# Patient Record
Sex: Male | Born: 1965 | Race: Black or African American | Hispanic: No | Smoking: Never smoker
Health system: Southern US, Community
[De-identification: ages and names within clinical notes are randomized; demographics above are authoritative.]

## PROBLEM LIST (undated history)

## (undated) DIAGNOSIS — E079 Disorder of thyroid, unspecified: Secondary | ICD-10-CM

## (undated) DIAGNOSIS — N529 Male erectile dysfunction, unspecified: Secondary | ICD-10-CM

## (undated) HISTORY — PX: WISDOM TOOTH EXTRACTION: SHX21

## (undated) HISTORY — DX: Male erectile dysfunction, unspecified: N52.9

---

## 2013-01-12 ENCOUNTER — Ambulatory Visit (INDEPENDENT_AMBULATORY_CARE_PROVIDER_SITE_OTHER): Payer: BC Managed Care – PPO | Admitting: Physician Assistant

## 2013-01-12 ENCOUNTER — Encounter: Payer: Self-pay | Admitting: Physician Assistant

## 2013-01-12 VITALS — BP 130/82 | HR 78 | Temp 98.2°F | Resp 14 | Ht 66.0 in | Wt 155.0 lb

## 2013-01-12 DIAGNOSIS — N529 Male erectile dysfunction, unspecified: Secondary | ICD-10-CM

## 2013-01-12 MED ORDER — SILDENAFIL CITRATE 100 MG PO TABS: 100.0000 mg | ORAL_TABLET | Freq: Every day | ORAL | Status: DC | PRN

## 2013-01-12 NOTE — Progress Notes (Signed)
   Patient ID: Aaron Mcgee MRN: [id number], DOB: 1966-05-12, 47 y.o. Date of Encounter: 01/12/2013, 7:34 PM    Chief Complaint:  Chief Complaint  Patient presents with  . Medication Refill    Has tried Viagra in past but would like to try Cialis     HPI: 47 y.o. year old AA male here for f/u of Erectile Dysfunction. His LOV here was 03/2010. Says he has been having physicals through his work. Says he went through a divorce so there was a period of time that he was not sexually active. However, he now is sexually active again and needs refill on Viagra. Says it worked well in past. Says he got the 100mg  but was able to cut them in half.  Has no other complaints.  Has no other medical history. Says his physicals have checked out to be fine.    Home Meds: See attached medication section for any medications that were entered at today's visit. The computer does not put those onto this list.The following list is a list of meds entered prior to today's visit.   No current outpatient prescriptions on file prior to visit.   No current facility-administered medications on file prior to visit.    Allergies: No Known Allergies    Review of Systems: See HPI for pertinent ROS. All other ROS negative.    Physical Exam: Blood pressure 130/82, pulse 78, temperature 98.2 F (36.8 C), temperature source Oral, resp. rate 14, height 5\' 6"  (1.676 m), weight 155 lb (70.308 kg)., Body mass index is 25.03 kg/(m^2). General: WNWD AAM. Appears in no acute distress. Lungs: Clear bilaterally to auscultation without wheezes, rales, or rhonchi. Breathing is unlabored. Heart: Regular rhythm. No murmurs, rubs, or gallops. Msk:  Strength and tone normal for age. Extremities/Skin: Warm and dry.  No edema. Neuro: Alert and oriented X 3. Moves all extremities spontaneously. Gait is normal. CNII-XII grossly in tact. Psych:  Responds to questions appropriately with a normal affect.     ASSESSMENT AND PLAN:   47 y.o. year old male with  1. Erectile dysfunction - sildenafil (VIAGRA) 100 MG tablet; Take 1 tablet (100 mg total) by mouth daily as needed for erectile dysfunction.  Dispense: 10 tablet; Refill: [address] [place], [place], [facility] 01/12/2013 7:34 PM

## 2013-07-12 ENCOUNTER — Telehealth: Payer: Self-pay | Admitting: Physician Assistant

## 2013-07-12 DIAGNOSIS — N529 Male erectile dysfunction, unspecified: Secondary | ICD-10-CM

## 2013-07-12 MED ORDER — SILDENAFIL CITRATE 100 MG PO TABS: 100.0000 mg | ORAL_TABLET | Freq: Every day | ORAL | Status: DC | PRN

## 2013-07-12 NOTE — Telephone Encounter (Signed)
RX sent to pharmacy  

## 2013-07-12 NOTE — Telephone Encounter (Signed)
Pharmacy in [place] generic of vigra, pt is trying to get the generic form and the pharmacy is needing a prescription sent over  Pharmacy is [facility] [phone] phone number) Call back number is [phone] (call pt once this taken care of)

## 2014-12-07 ENCOUNTER — Other Ambulatory Visit: Payer: Self-pay | Admitting: Family Medicine

## 2014-12-07 ENCOUNTER — Telehealth: Payer: Self-pay | Admitting: Physician Assistant

## 2014-12-07 DIAGNOSIS — Z0000 Encounter for general adult medical examination without abnormal findings: Secondary | ICD-10-CM

## 2014-12-07 NOTE — Telephone Encounter (Signed)
Pt has not been seen since 2014.  Has lab appt for tomorrow.  Needs to be seen by provider.  Left him a message.  We can not refill anything until he sees provider.  Has lab appt tomorro but needs provider appt.

## 2014-12-07 NOTE — Telephone Encounter (Signed)
Patient calling for refill of viagra [phone]

## 2014-12-08 ENCOUNTER — Encounter: Payer: Self-pay | Admitting: Family Medicine

## 2014-12-08 ENCOUNTER — Ambulatory Visit (INDEPENDENT_AMBULATORY_CARE_PROVIDER_SITE_OTHER): Payer: BLUE CROSS/BLUE SHIELD | Admitting: Family Medicine

## 2014-12-08 ENCOUNTER — Other Ambulatory Visit: Payer: BLUE CROSS/BLUE SHIELD

## 2014-12-08 VITALS — BP 110/60 | HR 80 | Temp 98.0°F | Resp 14 | Wt 169.0 lb

## 2014-12-08 DIAGNOSIS — Z8042 Family history of malignant neoplasm of prostate: Secondary | ICD-10-CM

## 2014-12-08 DIAGNOSIS — Z125 Encounter for screening for malignant neoplasm of prostate: Secondary | ICD-10-CM

## 2014-12-08 DIAGNOSIS — N521 Erectile dysfunction due to diseases classified elsewhere: Secondary | ICD-10-CM | POA: Diagnosis not present

## 2014-12-08 DIAGNOSIS — Z0000 Encounter for general adult medical examination without abnormal findings: Secondary | ICD-10-CM

## 2014-12-08 LAB — CBC WITH DIFFERENTIAL/PLATELET
Basophils Absolute: 0 10*3/uL (ref 0.0–0.1)
Basophils Relative: 0 % (ref 0–1)
Eosinophils Absolute: 0 10*3/uL (ref 0.0–0.7)
Eosinophils Relative: 1 % (ref 0–5)
HCT: 43.9 % (ref 39.0–52.0)
Hemoglobin: 14 g/dL (ref 13.0–17.0)
Lymphocytes Relative: 36 % (ref 12–46)
Lymphs Abs: 1.6 10*3/uL (ref 0.7–4.0)
MCH: 27.7 pg (ref 26.0–34.0)
MCHC: 31.9 g/dL (ref 30.0–36.0)
MCV: 86.9 fL (ref 78.0–100.0)
MPV: 9.4 fL (ref 8.6–12.4)
Monocytes Absolute: 0.4 10*3/uL (ref 0.1–1.0)
Monocytes Relative: 8 % (ref 3–12)
Neutro Abs: 2.5 10*3/uL (ref 1.7–7.7)
Neutrophils Relative %: 55 % (ref 43–77)
Platelets: 276 10*3/uL (ref 150–400)
RBC: 5.05 MIL/uL (ref 4.22–5.81)
RDW: 14.2 % (ref 11.5–15.5)
WBC: 4.5 10*3/uL (ref 4.0–10.5)

## 2014-12-08 LAB — COMPLETE METABOLIC PANEL WITH GFR
ALT: 32 U/L (ref 0–53)
AST: 18 U/L (ref 0–37)
Albumin: 3.9 g/dL (ref 3.5–5.2)
Alkaline Phosphatase: 63 U/L (ref 39–117)
BUN: 11 mg/dL (ref 6–23)
CO2: 27 mEq/L (ref 19–32)
Calcium: 9.3 mg/dL (ref 8.4–10.5)
Chloride: 104 mEq/L (ref 96–112)
Creat: 0.92 mg/dL (ref 0.50–1.35)
GFR, Est African American: >89 mL/min
GFR, Est Non African American: >89 mL/min
Glucose, Bld: 98 mg/dL (ref 70–99)
Potassium: 4.6 mEq/L (ref 3.5–5.3)
Sodium: 137 mEq/L (ref 135–145)
Total Bilirubin: 0.7 mg/dL (ref 0.2–1.2)
Total Protein: 7.1 g/dL (ref 6.0–8.3)

## 2014-12-08 LAB — LIPID PANEL
Cholesterol: 170 mg/dL (ref 0–200)
HDL: 92 mg/dL (ref >=40)
LDL Cholesterol: 69 mg/dL (ref 0–99)
Total CHOL/HDL Ratio: 1.8 Ratio
Triglycerides: 46 mg/dL (ref <150)
VLDL: 9 mg/dL (ref 0–40)

## 2014-12-08 MED ORDER — SILDENAFIL CITRATE 20 MG PO TABS: ORAL_TABLET | ORAL | Status: DC

## 2014-12-08 NOTE — Progress Notes (Signed)
   Subjective:    Patient ID: Aaron Mcgee, male    DOB: 1965-12-24, 49 y.o.   MRN: [id number]  HPI Patient has a history of erectile dysfunction. He is currently taking sildenafil, 20 mg tablets, 3 tablets by mouth daily when necessary erectile dysfunction. He is requesting a refill on his medication. He never uses nitroglycerin. He denies any chest pain or unstable angina. He denies any vision changes or hearing changes on the medication. He denies any side effects on the medication. He does have a very strong family history of prostate cancer. He is due for rectal exam as well as a PSA. Otherwise he is completely asymptomatic. No past medical history on file. No past surgical history on file. Current Outpatient Prescriptions on File Prior to Visit  Medication Sig Dispense Refill  . sildenafil (VIAGRA) 100 MG tablet Take 1 tablet (100 mg total) by mouth daily as needed for erectile dysfunction. 10 tablet 11   No current facility-administered medications on file prior to visit.   No Known Allergies History   Social History  . Marital Status: Married    Spouse Name: N/A  . Number of Children: N/A  . Years of Education: N/A   Occupational History  . Not on file.   Social History Main Topics  . Smoking status: Never Smoker   . Smokeless tobacco: Not on file  . Alcohol Use: No  . Drug Use: No  . Sexual Activity: Not on file   Other Topics Concern  . Not on file   Social History Narrative      Review of Systems  All other systems reviewed and are negative.      Objective:   Physical Exam  Constitutional: He appears well-developed and well-nourished.  Neck: Neck supple. No JVD present. No thyromegaly present.  Cardiovascular: Normal rate, regular rhythm, normal heart sounds and intact distal pulses.   No murmur heard. Pulmonary/Chest: Effort normal and breath sounds normal. No respiratory distress. He has no wheezes. He has no rales.  Abdominal: Soft. Bowel sounds  are normal. He exhibits no distension. There is no tenderness. There is no rebound and no guarding.  Genitourinary: Rectum normal and prostate normal.  Lymphadenopathy:    He has no cervical adenopathy.  Vitals reviewed.         Assessment & Plan:  Erectile dysfunction due to diseases classified elsewhere - Plan: sildenafil (REVATIO) 20 MG tablet  Prostate cancer screening  Patient's exam is completely normal. I will gladly refill his Viagra. Digital rectal exam is completely normal. Patient had lab work this morning including a CBC, CMP, fasting lipid panel, and PSA. Assuming there are no abnormalities in lab work, the patient can follow up again in one year or as needed

## 2014-12-09 LAB — PSA: PSA: 0.56 ng/mL (ref <=4.00)

## 2014-12-14 ENCOUNTER — Encounter: Payer: Self-pay | Admitting: Family Medicine

## 2015-06-21 ENCOUNTER — Ambulatory Visit (INDEPENDENT_AMBULATORY_CARE_PROVIDER_SITE_OTHER): Payer: BLUE CROSS/BLUE SHIELD | Admitting: Physician Assistant

## 2015-06-21 ENCOUNTER — Encounter: Payer: Self-pay | Admitting: Physician Assistant

## 2015-06-21 VITALS — BP 142/78 | HR 92 | Temp 99.7°F | Resp 18 | Wt 171.0 lb

## 2015-06-21 DIAGNOSIS — J029 Acute pharyngitis, unspecified: Secondary | ICD-10-CM

## 2015-06-21 DIAGNOSIS — J020 Streptococcal pharyngitis: Secondary | ICD-10-CM

## 2015-06-21 LAB — RAPID STREP SCREEN (MED CTR MEBANE ONLY): Streptococcus, Group A Screen (Direct): POSITIVE — AB

## 2015-06-21 MED ORDER — AMOXICILLIN 500 MG PO CAPS: 500.0000 mg | ORAL_CAPSULE | Freq: Three times a day (TID) | ORAL | Status: DC

## 2015-06-21 NOTE — Progress Notes (Signed)
    Patient ID: Aaron Mcgee MRN: [id number], DOB: 23-Mar-1966, 49 y.o. Date of Encounter: 06/21/2015, 11:00 AM    Chief Complaint:  Chief Complaint  Patient presents with  . bad sore throat     HPI: 49 y.o. year old AA male says that he developed sore throat 2 days ago but it got much worse yesterday. Says that it hurt all throughout the day yesterday and actually was worse towards the end of the day. Took Nyquill last night and has also taken some Tylenol. This morning severe sore throat again. Has had no mucus from the nose. No cough or chest congestion. Says that he has had no known fever but was running some low-grade fever when we checked it here. No known sick contacts.     Home Meds:   Outpatient Prescriptions Prior to Visit  Medication Sig Dispense Refill  . sildenafil (REVATIO) 20 MG tablet 3 tabs poqday prn 80 tablet 5  . sildenafil (VIAGRA) 100 MG tablet Take 1 tablet (100 mg total) by mouth daily as needed for erectile dysfunction. 10 tablet 11   No facility-administered medications prior to visit.    Allergies: No Known Allergies    Review of Systems: See HPI for pertinent ROS. All other ROS negative.    Physical Exam: Blood pressure 142/78, pulse 92, temperature 99.7 F (37.6 C), temperature source Oral, resp. rate 18, weight 171 lb (77.565 kg)., Body mass index is 27.61 kg/(m^2). General:  WNWD AAM. Appears in no acute distress. HEENT: Normocephalic, atraumatic, eyes without discharge, sclera non-icteric, nares are without discharge. Bilateral auditory canals clear, TM's are without perforation, pearly grey and translucent with reflective cone of light bilaterally.  Both tonsils are significantly enlarged and with moderate erythema. No exudate visualized. No peritonsillar abscess.  Neck: Supple. No thyromegaly. No lymphadenopathy. Lungs: Clear bilaterally to auscultation without wheezes, rales, or rhonchi. Breathing is unlabored. Heart: Regular rhythm. No  murmurs, rubs, or gallops. Msk:  Strength and tone normal for age. Extremities/Skin: Warm and dry.No rashes. Neuro: Alert and oriented X 3. Moves all extremities spontaneously. Gait is normal. CNII-XII grossly in tact. Psych:  Responds to questions appropriately with a normal affect.      ASSESSMENT AND PLAN:  49 y.o. year old male with   1. Strep pharyngitis Rapid strep test positive. Also patient's symptoms and exam findings consistent with strep. He is to start antibiotic immediately, take as directed, and complete all of it. Can use over-the-counter lozenges, spray, Tylenol, Motrin as needed for pain relief. Follow up if symptoms worsen significantly or do not resolve upon completion of antibiotic. - amoxicillin (AMOXIL) 500 MG capsule; Take 1 capsule (500 mg total) by mouth 3 (three) times daily.  Dispense: 30 capsule; Refill: 0  2. Sorethroat - Rapid strep screen (not at [facility])    Signed, [facility] Beth FairplayDixon, [place], [facility] 06/21/2015 11:00 AM

## 2015-12-11 ENCOUNTER — Other Ambulatory Visit: Payer: Self-pay | Admitting: Family Medicine

## 2015-12-11 ENCOUNTER — Encounter: Payer: Self-pay | Admitting: Family Medicine

## 2015-12-11 NOTE — Telephone Encounter (Signed)
Medication refill for one time only.  Patient needs to be seen.  Letter sent for patient to call and schedule 

## 2016-05-12 ENCOUNTER — Other Ambulatory Visit: Payer: Self-pay | Admitting: Family Medicine

## 2016-05-12 NOTE — Telephone Encounter (Signed)
Ok to refill 

## 2016-05-12 NOTE — Telephone Encounter (Signed)
Approved. # 50 + 1.

## 2016-05-13 NOTE — Telephone Encounter (Signed)
Prescription sent to pharmacy.

## 2016-06-03 ENCOUNTER — Other Ambulatory Visit: Payer: BLUE CROSS/BLUE SHIELD

## 2016-06-03 ENCOUNTER — Ambulatory Visit: Payer: BLUE CROSS/BLUE SHIELD | Admitting: Family Medicine

## 2016-06-03 VITALS — BP 118/70

## 2016-06-03 DIAGNOSIS — Z136 Encounter for screening for cardiovascular disorders: Secondary | ICD-10-CM

## 2016-06-03 NOTE — Progress Notes (Signed)
Pt walked into office to have PSA level drawn and BP check.  Except for 1 sick visit, pt has not been seen since 11/2014.  Explained to pt can not just walk in and have lab work done.  Pt needs to be seen and have an order from a provider.  He says he gets his complete physical at work but they do not do prostate check.  Explained again he still needs to see one of our providers.  He understands.  Did take BP, it was normal.  Had him make appt with provider to have prostate check and told him to bring his physical information from his workplace so we can update our records and better care for him.  Pt was very pleasant and understanding and says he will bring physical report from work to appt.

## 2016-06-10 ENCOUNTER — Ambulatory Visit: Payer: BLUE CROSS/BLUE SHIELD | Admitting: Family Medicine

## 2016-07-03 ENCOUNTER — Ambulatory Visit: Payer: BLUE CROSS/BLUE SHIELD | Admitting: Family Medicine

## 2016-07-09 ENCOUNTER — Ambulatory Visit: Payer: BLUE CROSS/BLUE SHIELD | Admitting: Physician Assistant

## 2016-08-26 ENCOUNTER — Other Ambulatory Visit: Payer: Self-pay | Admitting: Physician Assistant

## 2016-08-26 NOTE — Telephone Encounter (Signed)
Patient is due for an office visit/ letter mailed

## 2016-08-28 ENCOUNTER — Other Ambulatory Visit: Payer: Self-pay

## 2016-08-28 ENCOUNTER — Ambulatory Visit: Payer: Self-pay | Admitting: Physician Assistant

## 2016-08-28 MED ORDER — SILDENAFIL CITRATE 20 MG PO TABS: ORAL_TABLET | ORAL | 1 refills | Status: DC

## 2016-08-28 NOTE — Telephone Encounter (Signed)
Rx filled per protocol  

## 2016-09-03 ENCOUNTER — Ambulatory Visit: Payer: Self-pay | Admitting: Physician Assistant

## 2016-09-08 ENCOUNTER — Encounter: Payer: Self-pay | Admitting: Physician Assistant

## 2016-09-08 ENCOUNTER — Ambulatory Visit: Payer: BLUE CROSS/BLUE SHIELD | Admitting: Physician Assistant

## 2016-09-08 ENCOUNTER — Ambulatory Visit (INDEPENDENT_AMBULATORY_CARE_PROVIDER_SITE_OTHER): Payer: BLUE CROSS/BLUE SHIELD | Admitting: Physician Assistant

## 2016-09-08 DIAGNOSIS — N529 Male erectile dysfunction, unspecified: Secondary | ICD-10-CM | POA: Insufficient documentation

## 2016-09-08 MED ORDER — SILDENAFIL CITRATE 20 MG PO TABS: ORAL_TABLET | ORAL | 11 refills | Status: DC

## 2016-09-08 NOTE — Progress Notes (Signed)
    Patient ID: Aaron Mcgee MRN: [id number], DOB: 12-Aug-1965, 51 y.o. Date of Encounter: 09/08/2016, 4:09 PM    Chief Complaint:  Chief Complaint  Patient presents with  . Medication Refill     HPI: 51 y.o. year old male is here for f/u of ED to get refills on generic Viagra (sildenafil).   He has physicals through his work/job. Reports that they have medical providers and they have a point system and a have to be evaluated there. Reports that he has had no chronic medical problems and requires no daily medications. Has no hypertension, diabetes etc.  He needs refill on sildenafil to use as needed for ED.  The only other thing he wanted to evaluate today -- is that he has noticed some problems with short-term memory. Says that it has not affected his work/job. Has only noticed it in his "personal life" with his family he will forget dates of things. Says that he has started to write them down up or put them on a calendar and this does help but if he doesn't write something down or put it on his calendar, then he forgets about it and it has caused some issues-- and his family has brought it up.  No other concerns to address today.     Home Meds:   Outpatient Medications Prior to Visit  Medication Sig Dispense Refill  . sildenafil (REVATIO) 20 MG tablet TAKE 3 TABLETS BY MOUTH ONCE DAILY (ONLY IF NEEDED FOR SEXUAL ACTIVITY) 50 tablet 1  . amoxicillin (AMOXIL) 500 MG capsule Take 1 capsule (500 mg total) by mouth 3 (three) times daily. (Patient not taking: Reported on 09/08/2016) 30 capsule 0  . sildenafil (VIAGRA) 100 MG tablet Take 1 tablet (100 mg total) by mouth daily as needed for erectile dysfunction. (Patient not taking: Reported on 09/08/2016) 10 tablet 11   No facility-administered medications prior to visit.     Allergies: No Known Allergies    Review of Systems: See HPI for pertinent ROS. All other ROS negative.    Physical Exam: Blood pressure 118/62, pulse 74,  temperature 98.1 F (36.7 C), resp. rate 16, weight 158 lb 9.6 oz (71.9 kg), SpO2 98 %., Body mass index is 25.6 kg/m. General:  WNWD AAM. Appears in no acute distress. Neck: Supple. No thyromegaly. No lymphadenopathy. Lungs: Clear bilaterally to auscultation without wheezes, rales, or rhonchi. Breathing is unlabored. Heart: Regular rhythm. No murmurs, rubs, or gallops. Msk:  Strength and tone normal for age. Extremities/Skin: Warm and dry.  Neuro: Alert and oriented X 3. Moves all extremities spontaneously. Gait is normal. CNII-XII grossly in tact. Psych:  Responds to questions appropriately with a normal affect.    Mini Mental Exam performed. He scored 25 of 30  ASSESSMENT AND PLAN:  51 y.o. year old male with  1. Erectile dysfunction, unspecified erectile dysfunction type - sildenafil (REVATIO) 20 MG tablet; Take up to 5 at a time as directed  Dispense: 60 tablet; Refill: 11  Reassured him that MiniMental Exam does not indicate any significant dementia. Recommend he monitor at this point. Write things down. Use calendar. F/U if worsens.  Murray HodgkinsSigned, Mary Beth GrinnellDixon, [place], [facility] 09/08/2016 4:09 PM

## 2016-10-02 ENCOUNTER — Ambulatory Visit (INDEPENDENT_AMBULATORY_CARE_PROVIDER_SITE_OTHER): Payer: BLUE CROSS/BLUE SHIELD | Admitting: Physician Assistant

## 2016-10-02 ENCOUNTER — Encounter: Payer: Self-pay | Admitting: Physician Assistant

## 2016-10-02 VITALS — BP 124/70 | HR 68 | Temp 98.2°F | Resp 16 | Wt 162.6 lb

## 2016-10-02 DIAGNOSIS — M542 Cervicalgia: Secondary | ICD-10-CM | POA: Diagnosis not present

## 2016-10-02 MED ORDER — CYCLOBENZAPRINE HCL 10 MG PO TABS: 10.0000 mg | ORAL_TABLET | Freq: Three times a day (TID) | ORAL | 0 refills | Status: DC | PRN

## 2016-10-02 MED ORDER — MELOXICAM 7.5 MG PO TABS: 7.5000 mg | ORAL_TABLET | Freq: Every day | ORAL | 0 refills | Status: DC
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
## 2016-10-08 DIAGNOSIS — M47812 Spondylosis without myelopathy or radiculopathy, cervical region: Secondary | ICD-10-CM | POA: Diagnosis not present

## 2016-10-08 DIAGNOSIS — M67813 Other specified disorders of tendon, right shoulder: Secondary | ICD-10-CM | POA: Diagnosis not present

## 2016-10-08 DIAGNOSIS — M94211 Chondromalacia, right shoulder: Secondary | ICD-10-CM | POA: Diagnosis not present

## 2016-10-08 DIAGNOSIS — M19011 Primary osteoarthritis, right shoulder: Secondary | ICD-10-CM | POA: Diagnosis not present

## 2016-10-08 DIAGNOSIS — M7581 Other shoulder lesions, right shoulder: Secondary | ICD-10-CM | POA: Diagnosis not present

## 2016-10-08 DIAGNOSIS — M5021 Other cervical disc displacement,  high cervical region: Secondary | ICD-10-CM | POA: Diagnosis not present

## 2016-11-02 ENCOUNTER — Emergency Department (HOSPITAL_COMMUNITY)
Admission: EM | Admit: 2016-11-02 | Discharge: 2016-11-02 | Disposition: A | Discharge status: Home / Self Care | Payer: BLUE CROSS/BLUE SHIELD | Attending: Emergency Medicine | Admitting: Emergency Medicine

## 2016-11-02 ENCOUNTER — Encounter (HOSPITAL_COMMUNITY): Payer: Self-pay | Admitting: *Deleted

## 2016-11-02 ENCOUNTER — Emergency Department (HOSPITAL_COMMUNITY): Payer: BLUE CROSS/BLUE SHIELD

## 2016-11-02 DIAGNOSIS — Q892 Congenital malformations of other endocrine glands: Secondary | ICD-10-CM

## 2016-11-02 DIAGNOSIS — R042 Hemoptysis: Secondary | ICD-10-CM | POA: Diagnosis not present

## 2016-11-02 DIAGNOSIS — J029 Acute pharyngitis, unspecified: Secondary | ICD-10-CM | POA: Diagnosis present

## 2016-11-02 LAB — BASIC METABOLIC PANEL
Anion gap: 10 (ref 5–15)
BUN: 6 mg/dL (ref 6–20)
CO2: 27 mmol/L (ref 22–32)
Calcium: 9.8 mg/dL (ref 8.9–10.3)
Chloride: 103 mmol/L (ref 101–111)
Creatinine, Ser: 1.1 mg/dL (ref 0.61–1.24)
GFR calc Af Amer: >60 mL/min (ref >60)
GFR calc non Af Amer: >60 mL/min (ref >60)
Glucose, Bld: 92 mg/dL (ref 65–99)
Potassium: 4.3 mmol/L (ref 3.5–5.1)
Sodium: 140 mmol/L (ref 135–145)

## 2016-11-02 LAB — TSH: TSH: 0.63 u[IU]/mL (ref 0.350–4.500)

## 2016-11-02 LAB — CBC WITH DIFFERENTIAL/PLATELET
Basophils Absolute: 0 10*3/uL (ref 0.0–0.1)
Basophils Relative: 0 %
Eosinophils Absolute: 0 10*3/uL (ref 0.0–0.7)
Eosinophils Relative: 0 %
HCT: 44.8 % (ref 39.0–52.0)
Hemoglobin: 14.5 g/dL (ref 13.0–17.0)
Lymphocytes Relative: 33 %
Lymphs Abs: 1.7 10*3/uL (ref 0.7–4.0)
MCH: 28.3 pg (ref 26.0–34.0)
MCHC: 32.4 g/dL (ref 30.0–36.0)
MCV: 87.5 fL (ref 78.0–100.0)
Monocytes Absolute: 0.3 10*3/uL (ref 0.1–1.0)
Monocytes Relative: 6 %
Neutro Abs: 3.1 10*3/uL (ref 1.7–7.7)
Neutrophils Relative %: 61 %
Platelets: 263 10*3/uL (ref 150–400)
RBC: 5.12 MIL/uL (ref 4.22–5.81)
RDW: 13.4 % (ref 11.5–15.5)
WBC: 5.2 10*3/uL (ref 4.0–10.5)

## 2016-11-02 MED ORDER — IOPAMIDOL (ISOVUE-300) INJECTION 61%
INTRAVENOUS | Status: AC
  Administered 2016-11-02: 75 mL
  Filled 2016-11-02: qty 75

## 2016-11-02 MED ORDER — SODIUM CHLORIDE 0.9 % IV BOLUS (SEPSIS)
500.0000 mL | Freq: Once | INTRAVENOUS | Status: AC
  Administered 2016-11-02: 500 mL via INTRAVENOUS

## 2016-11-02 MED ORDER — FENTANYL CITRATE (PF) 100 MCG/2ML IJ SOLN
50.0000 ug | INTRAMUSCULAR | Status: DC | PRN
  Filled 2016-11-02: qty 2

## 2016-11-02 NOTE — ED Notes (Signed)
ED Provider at bedside. 

## 2016-11-02 NOTE — ED Notes (Signed)
Pt reports he has a noted "cyst" in his throat from a MRI. He reports increased coughing and pain in throat pointing to lower neck when speaking of pain. Pt reports with coughing today he heard a popping sound and has been coughing up bright red blood. He states the last time he coughed up blood was about two hours ago. NAD. Airway intact.

## 2016-11-02 NOTE — ED Provider Notes (Signed)
[facility]-EMERGENCY DEPT Provider Note   CSN: [id number] Arrival date & time: 11/02/16  1406     History   Chief Complaint Chief Complaint  Patient presents with  . Sore Throat    HPI Aaron Mcgee is a 51 y.o. male.  Patient presents with persistent pain in his throat and an episode of coughing up small amount of light or color blood that has since resolved prior to arrival. Patient has had symptoms for past 3 weeks. Patient has appointment with ENT this week. 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 Patient's had mild worsening symptoms. Patient is not on blood thinners. No smoking or alcohol use. Patient's had mild weight loss recently from decreased intake due to pain.      History reviewed. No pertinent past medical history.  Patient Active Problem List   Diagnosis Date Noted  . Erectile dysfunction 09/08/2016    History reviewed. No pertinent surgical history.     Home Medications    Prior to Admission medications   Medication Sig Start Date End Date Taking? Authorizing Provider  ibuprofen (ADVIL,MOTRIN) 200 MG tablet Take 400 mg by mouth every 6 (six) hours as needed (pain).   Yes Historical Provider, MD  sildenafil (REVATIO) 20 MG tablet TAKE 3 TABLETS BY MOUTH ONCE DAILY (ONLY IF NEEDED FOR SEXUAL ACTIVITY) Patient taking differently: Take 60 mg by mouth daily as needed (for sexual activity).  08/28/16  Yes Mary B Dixon, PA-C  Tetrahydrozoline HCl (VISINE OP) Place 1 drop into both eyes daily as needed (dry eyes).   Yes Historical Provider, MD  cyclobenzaprine (FLEXERIL) 10 MG tablet Take 1 tablet (10 mg total) by mouth 3 (three) times daily as needed for muscle spasms. Patient not taking: Reported on 11/02/2016 10/02/16   Dorena Bodo, PA-C  meloxicam (MOBIC) 7.5 MG tablet Take 1 tablet (7.5 mg total) by mouth daily. Patient not taking: Reported on 11/02/2016 10/02/16   Dorena Bodo, PA-C     Family History Family History  Problem Relation Age of Onset  . Cancer Father     prostate 51    Social History Social History  Substance Use Topics  . Smoking status: Never Smoker  . Smokeless tobacco: Never Used  . Alcohol use No     Allergies   Patient has no known allergies.   Review of Systems Review of Systems  Constitutional: Negative for chills and fever.  HENT: Positive for sore throat. Negative for congestion.   Eyes: Negative for visual disturbance.  Respiratory: Positive for cough and shortness of breath. Negative for stridor.   Cardiovascular: Negative for chest pain.  Gastrointestinal: Negative for abdominal pain and vomiting.  Genitourinary: Negative for dysuria and flank pain.  Musculoskeletal: Negative for back pain, neck pain and neck stiffness.  Skin: Negative for rash.  Neurological: Negative for light-headedness and headaches.     Physical Exam Updated Vital Signs BP (!) 139/97 (BP Location: Left Arm)   Pulse 73   Temp 98.3 F (36.8 C) (Oral)   Resp 18   SpO2 96%   Physical Exam  Constitutional: He is oriented to person, place, and time. He appears well-developed and well-nourished.  HENT:  Head: Normocephalic and atraumatic.  No exudate or erythema posterior no stridor. Patient is mild fullness anterior lower cervical region. No external sign of infection.  Eyes: Conjunctivae are normal. Right eye exhibits no discharge. Left eye exhibits no discharge.  Neck:  Normal range of motion. Neck supple. No tracheal deviation present.  Cardiovascular: Normal rate and regular rhythm.   Pulmonary/Chest: Effort normal and breath sounds normal.  Abdominal: Soft. He exhibits no distension. There is no tenderness. There is no guarding.  Musculoskeletal: He exhibits no edema.  Neurological: He is alert and oriented to person, place, and time.  Skin: Skin is warm. No rash noted.  Psychiatric: He has a normal mood and affect.  Nursing note and vitals  reviewed.    ED Treatments / Results  Labs (all labs ordered are listed, but only abnormal results are displayed) Labs Reviewed  CBC WITH DIFFERENTIAL/PLATELET  BASIC METABOLIC PANEL  TSH    EKG  EKG Interpretation None       Radiology No results found.  Procedures Procedures (including critical care time)  Medications Ordered in ED Medications  fentaNYL (SUBLIMAZE) injection 50 mcg (not administered)  sodium chloride 0.9 % bolus 500 mL (500 mLs Intravenous New Bag/Given 11/02/16 1720)     Initial Impression / Assessment and Plan / ED Course  I have reviewed the triage vital signs and the nursing notes.  Pertinent labs & imaging results that were available during my care of the patient were reviewed by me and considered in my medical decision making (see chart for details).    Patient presents with mild worsening of throat symptoms. Patient has close follow-up with an ENT provider this week. Plan for CT scan for further delineation after mild bleeding episode.  CT reviewed with ENT, pt no longer bleeding, outpt fup.   Results and differential diagnosis were discussed with the patient/parent/guardian. Xrays were independently reviewed by myself.  Close follow up outpatient was discussed, comfortable with the plan.   Medications  fentaNYL (SUBLIMAZE) injection 50 mcg (not administered)  sodium chloride 0.9 % bolus 500 mL (0 mLs Intravenous Stopped 11/02/16 1827)  iopamidol (ISOVUE-300) 61 % injection (75 mLs  Contrast Given 11/02/16 1845)    Vitals:   11/02/16 1745 11/02/16 1800 11/02/16 1815 11/02/16 1915  BP: (!) 147/96 (!) 132/91 135/89 134/90  Pulse: 69 64 64 65  Resp:   18   Temp:      TempSrc:      SpO2: 100% 100% 100% 100%    Final diagnoses:  Thyroglossal duct cyst     Final Clinical Impressions(s) / ED Diagnoses   Final diagnoses:  None    New Prescriptions New Prescriptions   No medications on file     Blane Ohara, MD 11/02/16  1946

## 2016-11-02 NOTE — ED Notes (Signed)
Patient transported to CT 

## 2016-11-02 NOTE — ED Notes (Signed)
Contacted CT, they are waiting on Labs for pt.

## 2016-11-02 NOTE — ED Notes (Signed)
Pulled pt fentanyl for pain, pt refused saying he wanted to be lucid for CT.

## 2016-11-02 NOTE — ED Notes (Signed)
Dr Zavitz at bedside  

## 2016-11-02 NOTE — ED Triage Notes (Signed)
Pt reports ongoing sore throat/irritation for weeks. Pt thinks its related to thyroid growth recently identified on MRI. Pt reports cough and difficulty swallowing. Pt had coughing episode today and "felt something pop" followed by coughing up small count of bright red blood. No acute distress is noted at this time. Airway intact.

## 2016-11-02 NOTE — Discharge Instructions (Signed)
Follow-up with ENT as previously arranged. If bleeding returns and is persistent have to return to the emergency department.  If you were given medicines take as directed.  If you are on coumadin or contraceptives realize their levels and effectiveness is altered by many different medicines.  If you have any reaction (rash, tongues swelling, other) to the medicines stop taking and see a physician.    If your blood pressure was elevated in the ER make sure you follow up for management with a primary doctor or return for chest pain, shortness of breath or stroke symptoms.  Please follow up as directed and return to the ER or see a physician for new or worsening symptoms.  Thank you. Vitals:   11/02/16 1745 11/02/16 1800 11/02/16 1815 11/02/16 1915  BP: (!) 147/96 (!) 132/91 135/89 134/90  Pulse: 69 64 64 65  Resp:   18   Temp:      TempSrc:      SpO2: 100% 100% 100% 100%

## 2016-11-02 NOTE — ED Notes (Signed)
Pt ambulatory at DC, NAD. Pt verbalizes DC teaching and follow up with ENT.  

## 2016-11-05 DIAGNOSIS — R070 Pain in throat: Secondary | ICD-10-CM | POA: Diagnosis not present

## 2016-11-05 DIAGNOSIS — R042 Hemoptysis: Secondary | ICD-10-CM | POA: Diagnosis not present

## 2016-11-05 DIAGNOSIS — Q892 Congenital malformations of other endocrine glands: Secondary | ICD-10-CM | POA: Diagnosis not present

## 2016-11-05 DIAGNOSIS — J343 Hypertrophy of nasal turbinates: Secondary | ICD-10-CM | POA: Diagnosis not present

## 2016-12-05 ENCOUNTER — Encounter: Payer: Self-pay | Admitting: Physician Assistant

## 2017-03-17 ENCOUNTER — Encounter: Payer: Self-pay | Admitting: Physician Assistant

## 2017-03-29 ENCOUNTER — Encounter (HOSPITAL_COMMUNITY): Payer: Self-pay | Admitting: Emergency Medicine

## 2017-03-29 DIAGNOSIS — Z5321 Procedure and treatment not carried out due to patient leaving prior to being seen by health care provider: Secondary | ICD-10-CM | POA: Insufficient documentation

## 2017-03-29 DIAGNOSIS — R1110 Vomiting, unspecified: Secondary | ICD-10-CM | POA: Diagnosis not present

## 2017-03-29 LAB — CBC
HCT: 44.1 % (ref 39.0–52.0)
Hemoglobin: 14.1 g/dL (ref 13.0–17.0)
MCH: 27.8 pg (ref 26.0–34.0)
MCHC: 32 g/dL (ref 30.0–36.0)
MCV: 86.8 fL (ref 78.0–100.0)
Platelets: 250 10*3/uL (ref 150–400)
RBC: 5.08 MIL/uL (ref 4.22–5.81)
RDW: 13.6 % (ref 11.5–15.5)
WBC: 6.1 10*3/uL (ref 4.0–10.5)

## 2017-03-29 LAB — COMPREHENSIVE METABOLIC PANEL
ALT: 23 U/L (ref 17–63)
AST: 15 U/L (ref 15–41)
Albumin: 3.9 g/dL (ref 3.5–5.0)
Alkaline Phosphatase: 73 U/L (ref 38–126)
Anion gap: 7 (ref 5–15)
BUN: 11 mg/dL (ref 6–20)
CO2: 24 mmol/L (ref 22–32)
Calcium: 9.4 mg/dL (ref 8.9–10.3)
Chloride: 106 mmol/L (ref 101–111)
Creatinine, Ser: 1.08 mg/dL (ref 0.61–1.24)
GFR calc Af Amer: >60 mL/min (ref >60)
GFR calc non Af Amer: >60 mL/min (ref >60)
Glucose, Bld: 88 mg/dL (ref 65–99)
Potassium: 4 mmol/L (ref 3.5–5.1)
Sodium: 137 mmol/L (ref 135–145)
Total Bilirubin: 0.7 mg/dL (ref 0.3–1.2)
Total Protein: 7 g/dL (ref 6.5–8.1)

## 2017-03-29 LAB — ABO/RH: ABO/RH(D): O POS

## 2017-03-29 LAB — TYPE AND SCREEN
ABO/RH(D): O POS
Antibody Screen: NEGATIVE

## 2017-03-29 NOTE — ED Notes (Signed)
Writer called for reassess of vital signs, no response

## 2017-03-29 NOTE — ED Notes (Signed)
After looking through notes pt seen here for same 4/22, dx with thyroglossal duct cyst

## 2017-03-29 NOTE — ED Triage Notes (Signed)
Pt reports emesis X1 with bright red blood/clots present. Denies abd pain/diarrhea. States this has happened before in past and he was told "he has a cyst in his throat"

## 2017-03-30 ENCOUNTER — Emergency Department (HOSPITAL_COMMUNITY)
Admission: EM | Admit: 2017-03-30 | Discharge: 2017-03-30 | Disposition: A | Discharge status: Home / Self Care | Payer: BLUE CROSS/BLUE SHIELD | Attending: Emergency Medicine | Admitting: Emergency Medicine

## 2017-04-14 ENCOUNTER — Encounter: Payer: Self-pay | Admitting: Physician Assistant

## 2017-04-26 ENCOUNTER — Emergency Department (HOSPITAL_COMMUNITY)
Admission: EM | Admit: 2017-04-26 | Discharge: 2017-04-27 | Disposition: A | Discharge status: Home / Self Care | Payer: BLUE CROSS/BLUE SHIELD | Attending: Emergency Medicine | Admitting: Emergency Medicine

## 2017-04-26 ENCOUNTER — Encounter (HOSPITAL_COMMUNITY): Payer: Self-pay | Admitting: Emergency Medicine

## 2017-04-26 ENCOUNTER — Emergency Department (HOSPITAL_COMMUNITY): Payer: BLUE CROSS/BLUE SHIELD

## 2017-04-26 DIAGNOSIS — R0789 Other chest pain: Secondary | ICD-10-CM | POA: Diagnosis not present

## 2017-04-26 DIAGNOSIS — R9431 Abnormal electrocardiogram [ECG] [EKG]: Secondary | ICD-10-CM | POA: Diagnosis not present

## 2017-04-26 DIAGNOSIS — R079 Chest pain, unspecified: Secondary | ICD-10-CM | POA: Diagnosis not present

## 2017-04-26 HISTORY — DX: Disorder of thyroid, unspecified: E07.9

## 2017-04-26 LAB — BASIC METABOLIC PANEL
Anion gap: 7 (ref 5–15)
BUN: 10 mg/dL (ref 6–20)
CO2: 25 mmol/L (ref 22–32)
Calcium: 8.8 mg/dL — ABNORMAL LOW (ref 8.9–10.3)
Chloride: 102 mmol/L (ref 101–111)
Creatinine, Ser: 0.97 mg/dL (ref 0.61–1.24)
GFR calc Af Amer: >60 mL/min (ref >60)
GFR calc non Af Amer: >60 mL/min (ref >60)
Glucose, Bld: 81 mg/dL (ref 65–99)
Potassium: 3.8 mmol/L (ref 3.5–5.1)
Sodium: 134 mmol/L — ABNORMAL LOW (ref 135–145)

## 2017-04-26 LAB — D-DIMER, QUANTITATIVE: D-Dimer, Quant: <0.27 ug/mL-FEU (ref 0.00–0.50)

## 2017-04-26 LAB — I-STAT TROPONIN, ED
Troponin i, poc: 0 ng/mL (ref 0.00–0.08)
Troponin i, poc: 0 ng/mL (ref 0.00–0.08)

## 2017-04-26 LAB — CBC
HCT: 41.3 % (ref 39.0–52.0)
Hemoglobin: 13 g/dL (ref 13.0–17.0)
MCH: 27.4 pg (ref 26.0–34.0)
MCHC: 31.5 g/dL (ref 30.0–36.0)
MCV: 86.9 fL (ref 78.0–100.0)
Platelets: 240 10*3/uL (ref 150–400)
RBC: 4.75 MIL/uL (ref 4.22–5.81)
RDW: 13.8 % (ref 11.5–15.5)
WBC: 5.7 10*3/uL (ref 4.0–10.5)

## 2017-04-26 MED ORDER — KETOROLAC TROMETHAMINE 15 MG/ML IJ SOLN
15.0000 mg | Freq: Once | INTRAMUSCULAR | Status: AC
  Administered 2017-04-26: 15 mg via INTRAVENOUS
  Filled 2017-04-26: qty 1

## 2017-04-26 MED ORDER — SODIUM CHLORIDE 0.9 % IV BOLUS (SEPSIS)
1000.0000 mL | Freq: Once | INTRAVENOUS | Status: AC
  Administered 2017-04-26: 1000 mL via INTRAVENOUS

## 2017-04-26 MED ORDER — ACETAMINOPHEN 325 MG PO TABS
650.0000 mg | ORAL_TABLET | Freq: Once | ORAL | Status: AC
  Administered 2017-04-26: 650 mg via ORAL
  Filled 2017-04-26: qty 2

## 2017-04-26 MED ORDER — NITROGLYCERIN 0.4 MG SL SUBL
0.4000 mg | SUBLINGUAL_TABLET | SUBLINGUAL | Status: DC | PRN
  Administered 2017-04-26: 0.4 mg via SUBLINGUAL
  Filled 2017-04-26: qty 1
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
## 2017-04-26 NOTE — ED Notes (Signed)
Patient transported to X-ray 
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
## 2017-04-26 NOTE — ED Notes (Signed)
Sent  Label to main lab to add on d-dimer.

## 2017-04-27 NOTE — ED Notes (Signed)
Pt departed in NAD.  

## 2017-09-29 ENCOUNTER — Other Ambulatory Visit: Payer: Self-pay | Admitting: Physician Assistant

## 2017-10-07 ENCOUNTER — Telehealth: Payer: Self-pay | Admitting: Physician Assistant

## 2017-10-07 ENCOUNTER — Other Ambulatory Visit: Payer: Self-pay

## 2017-10-07 ENCOUNTER — Encounter: Payer: Self-pay | Admitting: Physician Assistant

## 2017-10-07 ENCOUNTER — Ambulatory Visit (INDEPENDENT_AMBULATORY_CARE_PROVIDER_SITE_OTHER): Payer: BLUE CROSS/BLUE SHIELD | Admitting: Physician Assistant

## 2017-10-07 VITALS — BP 130/84 | HR 81 | Temp 98.0°F | Resp 16 | Ht 66.25 in | Wt 161.2 lb

## 2017-10-07 DIAGNOSIS — Z0000 Encounter for general adult medical examination without abnormal findings: Secondary | ICD-10-CM | POA: Diagnosis not present

## 2017-10-07 MED ORDER — SILDENAFIL CITRATE 20 MG PO TABS: ORAL_TABLET | ORAL | 0 refills | Status: DC

## 2017-10-07 NOTE — Telephone Encounter (Signed)
rx sent to pharmacy

## 2017-10-07 NOTE — Progress Notes (Signed)
Patient ID: Aaron Mcgee MRN: [id number], DOB: [birth year] 52 y.o. Date of Encounter: 10/07/2017, 8:48 AM    Chief Complaint: Physical (CPE)  HPI: 52 y.o. y/o male here for CPE.   He has no specific complaints or concerns to address today. Wanted to update preventive care/CPE and make sure to check PSA.  His father was diagnosed with prostate cancer at age 50.  Also patient is African-American. Discussed checking screening HIV test but he defers.  Says that this was checked because he worked in as a Water quality scientistphlebotomist in the past and has had this frequently and does not want to recheck today.   Review of Systems: Consitutional: No fever, chills, fatigue, night sweats, lymphadenopathy, or weight changes. Eyes: No visual changes, eye redness, or discharge. ENT/Mouth: Ears: No otalgia, tinnitus, hearing loss, discharge. Nose: No congestion, rhinorrhea, sinus pain, or epistaxis. Throat: No sore throat, post nasal drip, or teeth pain. Cardiovascular: No CP, palpitations, diaphoresis, DOE, edema, orthopnea, PND. Respiratory: No cough, hemoptysis, SOB, or wheezing. Gastrointestinal: No anorexia, dysphagia, reflux, pain, nausea, vomiting, hematemesis, diarrhea, constipation, BRBPR, or melena. Genitourinary: No dysuria, frequency, urgency, hematuria, incontinence, nocturia, decreased urinary stream, discharge, or testicular pain/masses. Musculoskeletal: No decreased ROM, myalgias, stiffness, joint swelling, or weakness. Skin: No rash, erythema, lesion changes, pain, warmth, jaundice, or pruritis. Neurological: No headache, dizziness, syncope, seizures, tremors, memory loss, coordination problems, or paresthesias. Psychological: No anxiety, depression, hallucinations, SI/HI. Endocrine: No fatigue, polydipsia, polyphagia, polyuria, or known diabetes. All other systems were reviewed and are otherwise negative.  Past Medical History:  Diagnosis Date  . Thyroid disease      No past surgical  history on file.  Home Meds:  Outpatient Medications Prior to Visit  Medication Sig Dispense Refill  . ibuprofen (ADVIL,MOTRIN) 200 MG tablet Take 400 mg by mouth every 6 (six) hours as needed (pain).    . sildenafil (REVATIO) 20 MG tablet TAKE UP TO 5 TABLETS BY MOUTH ONE TIME ONCE DAILY 60 tablet 0   No facility-administered medications prior to visit.     Allergies: No Known Allergies  Social History   Socioeconomic History  . Marital status: Married    Spouse name: Not on file  . Number of children: Not on file  . Years of education: Not on file  . Highest education level: Not on file  Occupational History  . Not on file  Social Needs  . Financial resource strain: Not on file  . Food insecurity:    Worry: Not on file    Inability: Not on file  . Transportation needs:    Medical: Not on file    Non-medical: Not on file  Tobacco Use  . Smoking status: Never Smoker  . Smokeless tobacco: Never Used  Substance and Sexual Activity  . Alcohol use: No  . Drug use: No  . Sexual activity: Not on file  Lifestyle  . Physical activity:    Days per week: Not on file    Minutes per session: Not on file  . Stress: Not on file  Relationships  . Social connections:    Talks on phone: Not on file    Gets together: Not on file    Attends religious service: Not on file    Active member of club or organization: Not on file    Attends meetings of clubs or organizations: Not on file    Relationship status: Not on file  . Intimate partner violence:    Fear of current or  ex partner: Not on file    Emotionally abused: Not on file    Physically abused: Not on file    Forced sexual activity: Not on file  Other Topics Concern  . Not on file  Social History Narrative  . Not on file    Family History  Problem Relation Age of Onset  . Cancer Father        prostate 65    Physical Exam: Blood pressure 130/84, pulse 81, temperature 98 F (36.7 C), temperature source Oral, resp.  rate 16, height 5' 6.25" (1.683 m), weight 73.1 kg (161 lb 3.2 oz), SpO2 98 %.  General: Well developed, well nourished, in no acute distress. HEENT: Normocephalic, atraumatic. Conjunctiva pink, sclera non-icteric. Pupils 2 mm constricting to 1 mm, round, regular, and equally reactive to light and accomodation. EOMI. Internal auditory canal clear. TMs with good cone of light and without pathology. Nasal mucosa pink. Nares are without discharge. No sinus tenderness. Oral mucosa pink. Dentition good. Pharynx without exudate.   Neck: Supple. Trachea midline. No thyromegaly. Full ROM. No lymphadenopathy.  No carotid bruits. Lungs: Clear to auscultation bilaterally without wheezes, rales, or rhonchi. Breathing is of normal effort and unlabored. Cardiovascular: RRR with S1 S2. No murmurs, rubs, or gallops. Distal pulses 2+ symmetrically. No carotid or abdominal bruits. Abdomen: Soft, non-tender, non-distended with normoactive bowel sounds. No hepatosplenomegaly or masses. No rebound/guarding. No CVA tenderness. No hernias. Musculoskeletal: Full range of motion and 5/5 strength throughout.  Skin: Warm and moist without erythema, ecchymosis, wounds, or rash. Neuro: A+Ox3. CN II-XII grossly intact. Moves all extremities spontaneously. Full sensation throughout. Normal gait.  Psych:  Responds to questions appropriately with a normal affect.   Assessment/Plan:  52 y.o. y/o AA male here for CPE  -1. Encounter for preventive health examination  A. Screening Labs: - CBC with Differential/Platelet - COMPLETE METABOLIC PANEL WITH GFR - Lipid panel - PSA - VITAMIN D 25 Hydroxy (Vit-D Deficiency, Fractures) - HIV antibody---He does not want to check this again. This was removed from orders   B. Screening For Prostate Cancer: His father was diagnosed with prostate cancer at age 61.  Patient is African-American.  Therefore will go ahead and check screening PSA. - PSA   C. Screening For Colorectal Cancer:    He has no family history of colon cancer.  We will discuss this at age 10.  D. Immunizations: Flu--------------------N/A Tetanus------------He reports that he received this at his clinic at work in the year 2000.  Discussed that this would be due within the next year but he wants to hold off on this. Pneumococcal--- He has no indication to require pneumonia vaccine until age 75. Shingrix------------  Will discuss once he is actually age 35.  Discuss next year.  Signed:   [address] [place], [place]  10/07/2017 8:48 AM

## 2017-10-07 NOTE — Telephone Encounter (Signed)
Patient forgot to let you know he needed a refill on his sildenafil  Aaron Mcgee [facility] [place]

## 2017-10-08 LAB — COMPLETE METABOLIC PANEL WITH GFR
AG Ratio: 1.4 (calc) (ref 1.0–2.5)
ALT: 33 U/L (ref 9–46)
AST: 18 U/L (ref 10–40)
Albumin: 4.3 g/dL (ref 3.6–5.1)
Alkaline phosphatase (APISO): 101 U/L (ref 40–115)
BUN: 10 mg/dL (ref 7–25)
CO2: 27 mmol/L (ref 20–32)
Calcium: 10 mg/dL (ref 8.6–10.3)
Chloride: 105 mmol/L (ref 98–110)
Creat: 0.99 mg/dL (ref 0.60–1.35)
GFR, Est African American: 103 mL/min/{1.73_m2} (ref >=60)
GFR, Est Non African American: 89 mL/min/{1.73_m2} (ref >=60)
Globulin: 3.1 g/dL (calc) (ref 1.9–3.7)
Glucose, Bld: 97 mg/dL (ref 65–99)
Potassium: 4.5 mmol/L (ref 3.5–5.3)
Sodium: 138 mmol/L (ref 135–146)
Total Bilirubin: 0.6 mg/dL (ref 0.2–1.2)
Total Protein: 7.4 g/dL (ref 6.1–8.1)

## 2017-10-08 LAB — CBC WITH DIFFERENTIAL/PLATELET
Basophils Absolute: 21 cells/uL (ref 0–200)
Basophils Relative: 0.5 %
Eosinophils Absolute: 29 cells/uL (ref 15–500)
Eosinophils Relative: 0.7 %
HCT: 45.1 % (ref 38.5–50.0)
Hemoglobin: 14.5 g/dL (ref 13.2–17.1)
Lymphs Abs: 1550 cells/uL (ref 850–3900)
MCH: 27.8 pg (ref 27.0–33.0)
MCHC: 32.2 g/dL (ref 32.0–36.0)
MCV: 86.6 fL (ref 80.0–100.0)
MPV: 10.5 fL (ref 7.5–12.5)
Monocytes Relative: 10.2 %
Neutro Abs: 2171 cells/uL (ref 1500–7800)
Neutrophils Relative %: 51.7 %
Platelets: 300 10*3/uL (ref 140–400)
RBC: 5.21 10*6/uL (ref 4.20–5.80)
RDW: 13 % (ref 11.0–15.0)
Total Lymphocyte: 36.9 %
WBC mixed population: 428 cells/uL (ref 200–950)
WBC: 4.2 10*3/uL (ref 3.8–10.8)

## 2017-10-08 LAB — LIPID PANEL
Cholesterol: 178 mg/dL (ref <200)
HDL: 84 mg/dL (ref >40)
LDL Cholesterol (Calc): 83 mg/dL (calc)
Non-HDL Cholesterol (Calc): 94 mg/dL (calc) (ref <130)
Total CHOL/HDL Ratio: 2.1 (calc) (ref <5.0)
Triglycerides: 39 mg/dL (ref <150)

## 2017-10-08 LAB — VITAMIN D 25 HYDROXY (VIT D DEFICIENCY, FRACTURES): Vit D, 25-Hydroxy: 52 ng/mL (ref 30–100)

## 2017-10-08 LAB — PSA: PSA: 0.5 ng/mL (ref <=4.0)

## 2017-12-10 DIAGNOSIS — H04123 Dry eye syndrome of bilateral lacrimal glands: Secondary | ICD-10-CM | POA: Diagnosis not present

## 2018-06-05 DIAGNOSIS — J0100 Acute maxillary sinusitis, unspecified: Secondary | ICD-10-CM | POA: Diagnosis not present

## 2018-06-05 DIAGNOSIS — R030 Elevated blood-pressure reading, without diagnosis of hypertension: Secondary | ICD-10-CM | POA: Diagnosis not present

## 2018-10-12 ENCOUNTER — Encounter: Payer: BLUE CROSS/BLUE SHIELD | Admitting: Family Medicine

## 2018-11-10 ENCOUNTER — Other Ambulatory Visit: Payer: Self-pay

## 2018-11-10 MED ORDER — SILDENAFIL CITRATE 20 MG PO TABS: ORAL_TABLET | ORAL | 0 refills | Status: DC

## 2018-11-11 ENCOUNTER — Encounter: Payer: Self-pay | Admitting: Family Medicine

## 2018-11-11 ENCOUNTER — Ambulatory Visit (INDEPENDENT_AMBULATORY_CARE_PROVIDER_SITE_OTHER): Payer: Self-pay | Admitting: Family Medicine

## 2018-11-11 ENCOUNTER — Other Ambulatory Visit: Payer: Self-pay

## 2018-11-11 DIAGNOSIS — N529 Male erectile dysfunction, unspecified: Secondary | ICD-10-CM

## 2018-11-11 MED ORDER — SILDENAFIL CITRATE 50 MG PO TABS: 50.0000 mg | ORAL_TABLET | Freq: Every day | ORAL | 5 refills | Status: DC | PRN

## 2018-11-11 NOTE — Patient Instructions (Signed)
Return for Complete physical when able

## 2018-11-11 NOTE — Progress Notes (Signed)
Patient ID: Aaron Mcgee, male    DOB: 1966/04/22, 53 y.o.   MRN: [id number]  PCP: Danelle Berryapia, Tilmon Wisehart, PA-C   Virtual Visit via telephone   Phone visit arranged with Aaron Mcgee for 11/11/18 at 10:00 AM EDT  Services provided today were via telemedicine through telephone call. Start of phone call:  10:32 AM  I verified that I was speaking with the correct person using two identifiers. Patient reported their location during encounter was at in his car driving home - agreed to continue to speak while he was driving  Patient consented to telephone visit  [facility]  Referring Provider:   Danelle Berryapia, Pritesh Sobecki, PA-C   All participants in encounter:  Myself and the patient   I discussed the limitations, risks, security and privacy concerns of performing an evaluation and management service by telephone and the availability of in person appointments. I also discussed with the patient that there may be a patient responsible charge related to this service. The patient expressed understanding and agreed to proceed.  Chief Complaint  Patient presents with  . Medication Refill    sidenafil    Subjective:   Aaron Mcgee is a 53 y.o. male, ED med refill.  He has been on sildenafil for several years.  Usually take 2- 20 mg tablets on average 2-3 x a week.  He denies adverse SE of the medications, specifically no prolonged erection, near syncope, HA.  New to my panel of patient, previously PCP Frazier RichardsMary Beth Dixon. Last CPE was just over a year ago Not on any other medication He has employee screening each year, his last labs for cholesterol and DM were all normal per the pt.  He has elevated BP sometimes with his employee physicals, but usually when BP is repeated it is in normal range.  He denies any known elevated BP at home.    Patient Active Problem List   Diagnosis Date Noted  . Erectile dysfunction 09/08/2016    Prior  to Admission medications   Medication Sig Start Date End Date Taking? Authorizing Provider  ibuprofen (ADVIL,MOTRIN) 200 MG tablet Take 400 mg by mouth every 6 (six) hours as needed (pain).   Yes [provider]  sildenafil (REVATIO) 20 MG tablet TAKE UP TO 5 TABLETS BY MOUTH ONE TIME ONCE DAILY 11/10/18  Yes Danelle Berryapia, Lyly Canizales, PA-C    No Known Allergies  Review of Systems  Constitutional: Negative.   HENT: Negative.   Eyes: Negative.   Respiratory: Negative.   Cardiovascular: Negative.   Gastrointestinal: Negative.   Endocrine: Negative.   Genitourinary: Negative.   Musculoskeletal: Negative.   Skin: Negative.   Allergic/Immunologic: Negative.   Neurological: Negative.   Hematological: Negative.   Psychiatric/Behavioral: Negative.   All other systems reviewed and are negative.      Objective:    There were no vitals filed for this visit.    Physical Exam  Limited due to telephone encounter Pt alert, speaking in clear and complete sentences     Assessment & Plan:     ICD-10-CM   1. Erectile dysfunction, unspecified erectile dysfunction type N52.9 sildenafil (VIAGRA) 50 MG tablet     I discussed the assessment and treatment plan with the patient. The patient was provided an opportunity to ask questions and all were answered. The patient agreed with the plan and demonstrated an understanding of the instructions.   The patient was advised to call back  or seek an in-person evaluation if the symptoms worsen or if the condition fails to improve as anticipated.  Phone call concluded at 10:42 AM  I provided 10 minutes of non-face-to-face time during this encounter.  Danelle Berry, PA-C 11/11/18 10:32 AM

## 2018-11-25 ENCOUNTER — Other Ambulatory Visit: Payer: Self-pay | Admitting: Family Medicine

## 2018-11-25 DIAGNOSIS — N529 Male erectile dysfunction, unspecified: Secondary | ICD-10-CM

## 2019-02-23 DIAGNOSIS — Z20828 Contact with and (suspected) exposure to other viral communicable diseases: Secondary | ICD-10-CM | POA: Diagnosis not present

## 2019-05-20 ENCOUNTER — Telehealth: Payer: Self-pay | Admitting: Family Medicine

## 2019-05-20 DIAGNOSIS — Z1211 Encounter for screening for malignant neoplasm of colon: Secondary | ICD-10-CM

## 2019-05-20 NOTE — Telephone Encounter (Signed)
Patient has cpe scheduled for January with dr Buelah Manis, however would like to go ahead and get his colonoscopy scheduled if possible   [phone]

## 2019-05-20 NOTE — Telephone Encounter (Signed)
Ok to order 

## 2019-05-20 NOTE — Telephone Encounter (Signed)
Referral orders placed

## 2019-05-20 NOTE — Telephone Encounter (Signed)
Okay to place GI referral for scope

## 2019-06-01 IMAGING — CR DG CHEST 2V
2 series · 2 of 2 positions shown · non-contrast
Comparison: None.

CLINICAL DATA: Midchest pain today.

EXAM:
CHEST  2 VIEW

[chest pa]
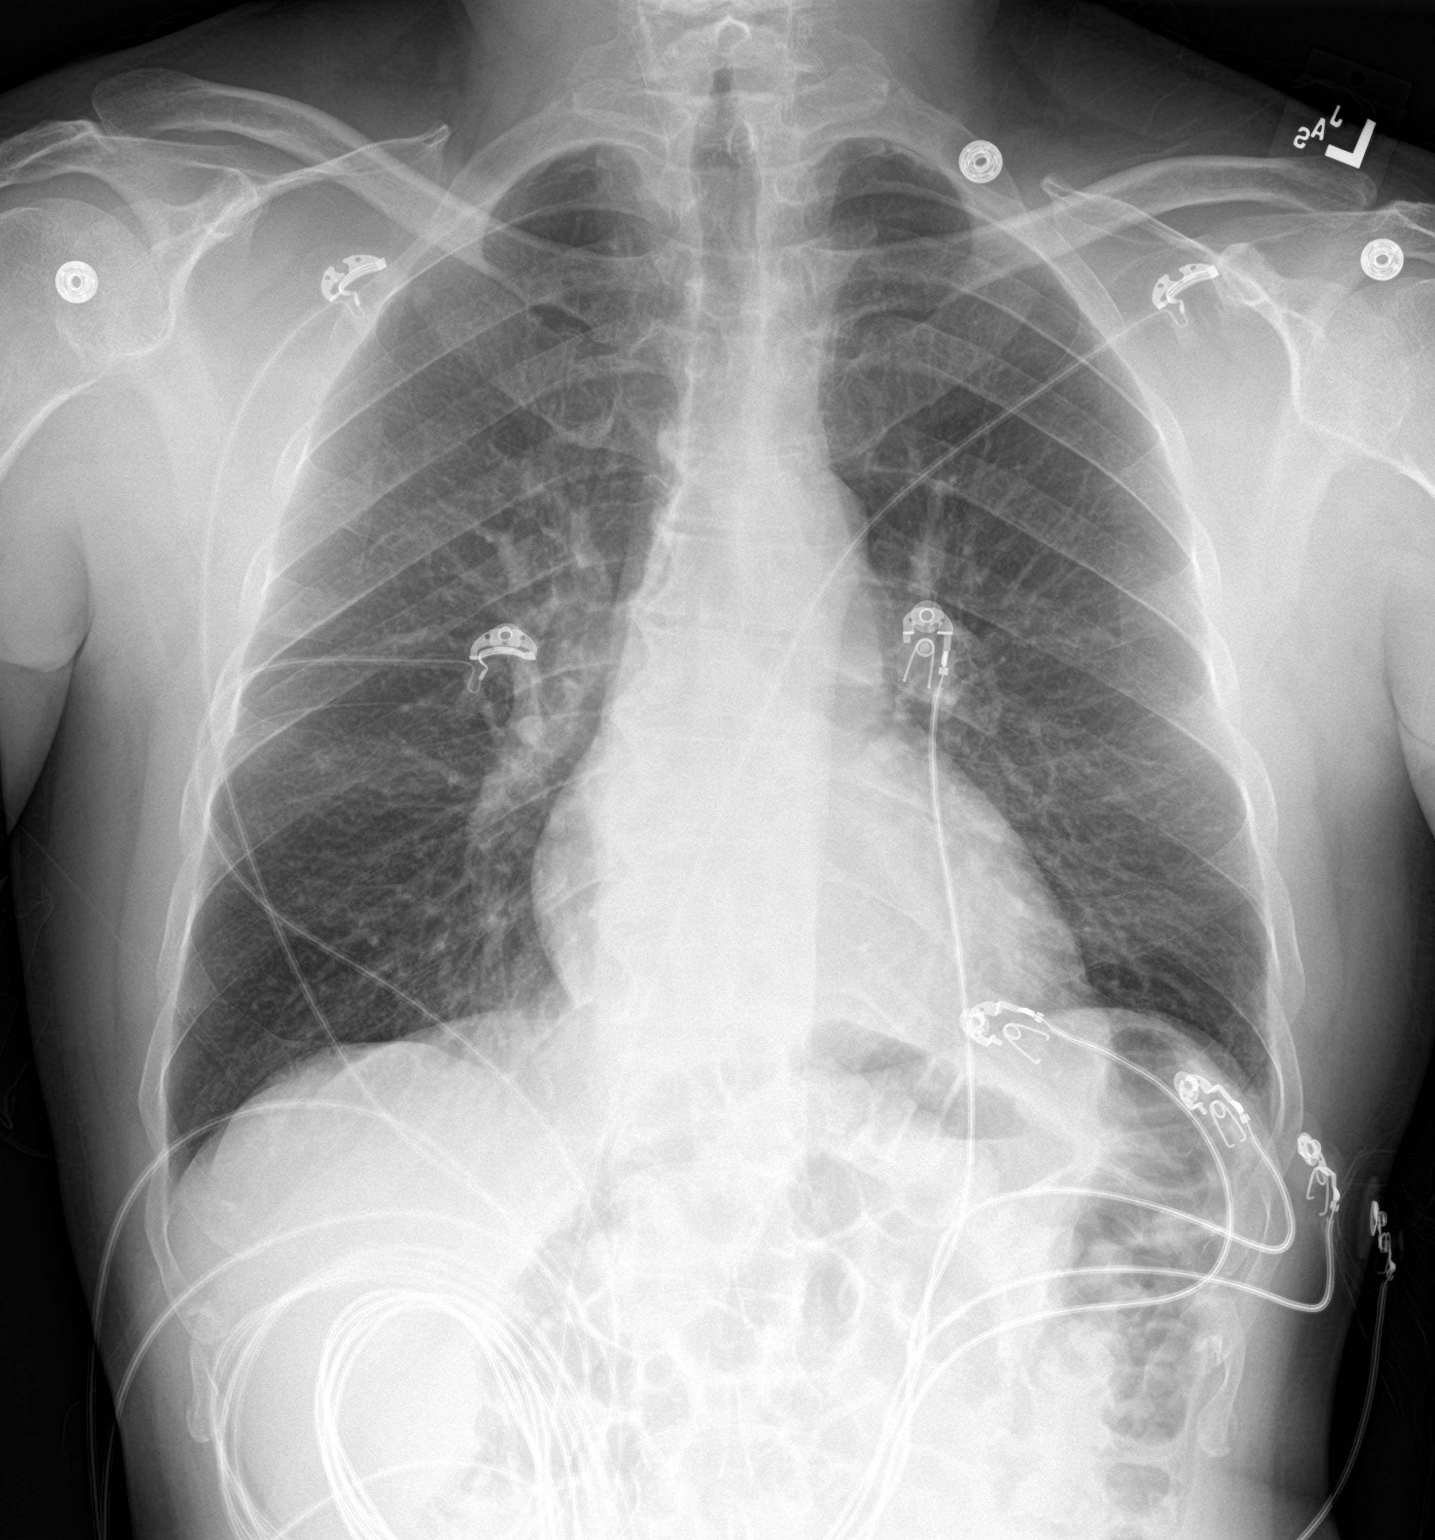

[chest lat]
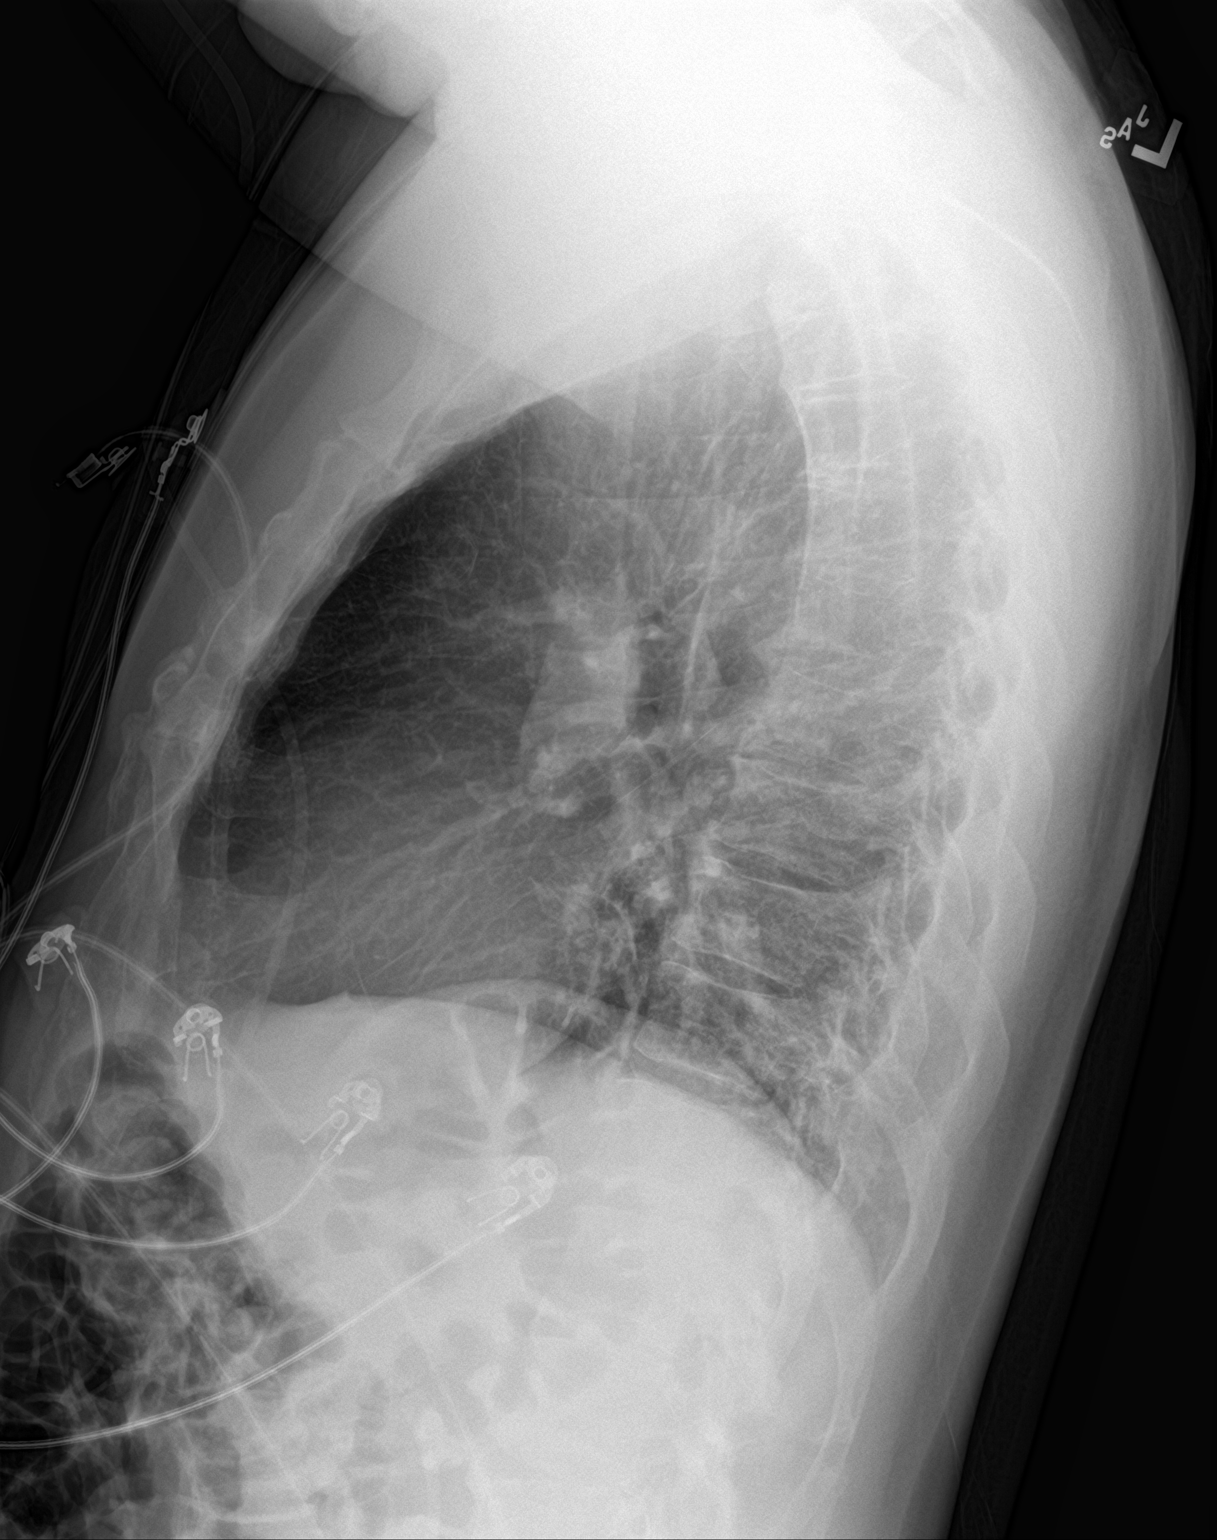

[2 of 2 positions shown; findings below may reference images not displayed]

FINDINGS: The lungs are clear. The pulmonary vasculature is normal. Heart size
is normal. Hilar and mediastinal contours are unremarkable. There is
no pleural effusion.
IMPRESSION: No active cardiopulmonary disease.

## 2019-06-13 ENCOUNTER — Encounter: Payer: Self-pay | Admitting: Family Medicine

## 2019-06-20 ENCOUNTER — Other Ambulatory Visit: Payer: Self-pay | Admitting: *Deleted

## 2019-06-20 MED ORDER — SILDENAFIL CITRATE 20 MG PO TABS: ORAL_TABLET | ORAL | 1 refills | Status: DC

## 2019-06-20 NOTE — Telephone Encounter (Signed)
Received call from patient.   Reports that he was taking Sildenafil 20mg - up to 5 tabs PRN for ED. Last visit with Delsa Grana, PA-C in April 2020, medication was changed to Sildenafil 50mg  tabs.   Reports that he would like to switch back to Sildenafil 20mg .   Ok to refill?

## 2019-08-10 ENCOUNTER — Other Ambulatory Visit: Payer: Self-pay

## 2019-08-10 ENCOUNTER — Ambulatory Visit (INDEPENDENT_AMBULATORY_CARE_PROVIDER_SITE_OTHER): Payer: BLUE CROSS/BLUE SHIELD | Admitting: Family Medicine

## 2019-08-10 ENCOUNTER — Encounter: Payer: Self-pay | Admitting: Family Medicine

## 2019-08-10 VITALS — BP 128/70 | HR 86 | Temp 97.8°F | Resp 12 | Ht 66.0 in | Wt 178.0 lb

## 2019-08-10 DIAGNOSIS — Z0000 Encounter for general adult medical examination without abnormal findings: Secondary | ICD-10-CM

## 2019-08-10 DIAGNOSIS — R0683 Snoring: Secondary | ICD-10-CM

## 2019-08-10 DIAGNOSIS — Q892 Congenital malformations of other endocrine glands: Secondary | ICD-10-CM

## 2019-08-10 DIAGNOSIS — Z1211 Encounter for screening for malignant neoplasm of colon: Secondary | ICD-10-CM

## 2019-08-10 DIAGNOSIS — N529 Male erectile dysfunction, unspecified: Secondary | ICD-10-CM

## 2019-08-10 DIAGNOSIS — Z125 Encounter for screening for malignant neoplasm of prostate: Secondary | ICD-10-CM

## 2019-08-10 DIAGNOSIS — Z13220 Encounter for screening for lipoid disorders: Secondary | ICD-10-CM

## 2019-08-10 NOTE — Progress Notes (Signed)
Subjective:    Patient ID: Aaron Mcgee, male    DOB: 04-28-1966, 54 y.o.   MRN: [id number]  Patient presents for Annual Exam (is fasting)  Patient here for CPE.  Medications and history reviewed. Immunizations-tetanus booster  Family history positive for prostate cancer in his father at age 27.  2nd cousin that died from prostate cancer at age 81  Colonoscopy due Thyroglossal cyst noted 2 year ago at times feels like something is in his throat, no dysphiagia  with eating no reflux symptoms.  When he falls asleep he feels like there might be a flap in his throat.  His girlfriend also states that he snores a lot.  He would like to go back to ENT for reevaluation they did discuss surgery at one point. 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  He does try to stay active he runs mostly for some resistance training.      Erectile  dysfunction he does take sildenafil as needed  [facility]- [place], no makor ossies, wears glasses  Dental care every 6 months   Typically runs for exercise   Has adult daughter 38 / senior year 37     Mother has hypothyroidism.  Review Of Systems:  GEN- denies fatigue, fever, weight loss,weakness, recent illness HEENT- denies eye drainage, change in vision, nasal discharge, CVS- denies chest pain, palpitations RESP- denies SOB, cough, wheeze ABD- denies N/V, change in stools, abd pain GU- denies dysuria, hematuria, dribbling, incontinence MSK- denies joint pain, muscle aches, injury Neuro- denies headache, dizziness, syncope, seizure activity       Objective:    BP 128/70   Pulse 86   Temp 97.8 F (36.6 C) (Temporal)   Resp 12   Ht 5\' 6"  (1.676 m)   Wt 178 lb (80.7 kg)   SpO2 98%   BMI 28.73 kg/m  GEN- NAD, alert and oriented x3 HEENT- PERRL, EOMI, non injected  sclera, pink conjunctiva, MMM, oropharynx clear, TM clear bilaterally no effusion nares clear Neck- Supple, no thyromegaly CVS- RRR, no murmur RESP-CTAB ABD-NABS,soft,NT,ND Psych normal affect and mood EXT- No edema Pulses- Radial, DP- 2+        Assessment & Plan:      Problem List Items Addressed This Visit      Unprioritized   Erectile dysfunction    As needed sildenafil      Thyroglossal duct cyst    Referral back to ENT for reevaluation.  They did discuss surgical intervention in the past if he had recurrent symptoms.  He does not have any reflux symptoms.      Relevant Orders   Ambulatory referral to ENT    Other Visit Diagnoses    Routine general medical examination at a health care facility    -  Primary   CPE done.  Fasting labs obtained.  He is can work on dietary changes and getting back to his typical weight.  Shingrix sent to pharmacy.,  Declines flu shot   Relevant Orders   CBC with Differential/Platelet   Comprehensive metabolic panel   Lipid panel   TSH   Screening for lipid disorders       Relevant Orders   Lipid panel   Colon cancer screening       This time he will proceed with  Cologuard screening.   Prostate cancer screening       PSA to be done.  Family history of prostate cancer in first-degree relatives   Relevant Orders   PSA   Snoring       Prior to ENT.  He may need sleep study done if nothing found on his evaluation      Note: This dictation was prepared with Dragon dictation along with smaller phrase technology. Any transcriptional errors that result from this process are unintentional.

## 2019-08-10 NOTE — Patient Instructions (Addendum)
I recommend eye visit once a year I recommend dental visit every 6 months Goal is to  Exercise 30 minutes 5 days a week We will call with lab results  Shingles vaccine sent to pharmacy  Referral to ENT  F/U 1 year for Physical

## 2019-08-10 NOTE — Assessment & Plan Note (Addendum)
Referral back to ENT for reevaluation.  They did discuss surgical intervention in the past if he had recurrent symptoms.  He does not have any reflux symptoms.

## 2019-08-10 NOTE — Assessment & Plan Note (Signed)
As needed sildenafil

## 2019-08-11 ENCOUNTER — Telehealth: Payer: Self-pay | Admitting: *Deleted

## 2019-08-11 LAB — COMPREHENSIVE METABOLIC PANEL
AG Ratio: 1.3 (calc) (ref 1.0–2.5)
ALT: 28 U/L (ref 9–46)
AST: 19 U/L (ref 10–35)
Albumin: 4.1 g/dL (ref 3.6–5.1)
Alkaline phosphatase (APISO): 80 U/L (ref 35–144)
BUN: 14 mg/dL (ref 7–25)
CO2: 29 mmol/L (ref 20–32)
Calcium: 9.5 mg/dL (ref 8.6–10.3)
Chloride: 102 mmol/L (ref 98–110)
Creat: 1.23 mg/dL (ref 0.70–1.33)
Globulin: 3.1 g/dL (calc) (ref 1.9–3.7)
Glucose, Bld: 93 mg/dL (ref 65–99)
Potassium: 4.3 mmol/L (ref 3.5–5.3)
Sodium: 137 mmol/L (ref 135–146)
Total Bilirubin: 0.6 mg/dL (ref 0.2–1.2)
Total Protein: 7.2 g/dL (ref 6.1–8.1)

## 2019-08-11 LAB — CBC WITH DIFFERENTIAL/PLATELET
Absolute Monocytes: 467 cells/uL (ref 200–950)
Basophils Absolute: 40 cells/uL (ref 0–200)
Basophils Relative: 0.7 %
Eosinophils Absolute: 68 cells/uL (ref 15–500)
Eosinophils Relative: 1.2 %
HCT: 46.7 % (ref 38.5–50.0)
Hemoglobin: 15 g/dL (ref 13.2–17.1)
Lymphs Abs: 2149 cells/uL (ref 850–3900)
MCH: 27.8 pg (ref 27.0–33.0)
MCHC: 32.1 g/dL (ref 32.0–36.0)
MCV: 86.5 fL (ref 80.0–100.0)
MPV: 10.4 fL (ref 7.5–12.5)
Monocytes Relative: 8.2 %
Neutro Abs: 2975 cells/uL (ref 1500–7800)
Neutrophils Relative %: 52.2 %
Platelets: 293 10*3/uL (ref 140–400)
RBC: 5.4 10*6/uL (ref 4.20–5.80)
RDW: 12.7 % (ref 11.0–15.0)
Total Lymphocyte: 37.7 %
WBC: 5.7 10*3/uL (ref 3.8–10.8)

## 2019-08-11 LAB — LIPID PANEL
Cholesterol: 189 mg/dL (ref <200)
HDL: 73 mg/dL (ref >=40)
LDL Cholesterol (Calc): 96 mg/dL (calc)
Non-HDL Cholesterol (Calc): 116 mg/dL (calc) (ref <130)
Total CHOL/HDL Ratio: 2.6 (calc) (ref <5.0)
Triglycerides: 108 mg/dL (ref <150)

## 2019-08-11 LAB — TSH: TSH: 0.84 mIU/L (ref 0.40–4.50)

## 2019-08-11 LAB — PSA: PSA: 0.5 ng/mL (ref <=4.0)

## 2019-08-11 MED ORDER — SHINGRIX 50 MCG/0.5ML IM SUSR: 0.5000 mL | Freq: Once | INTRAMUSCULAR | 1 refills | Status: AC

## 2019-08-11 NOTE — Addendum Note (Signed)
Addended by: Phillips Odor on: 08/11/2019 08:18 AM   Modules accepted: Orders

## 2019-08-11 NOTE — Telephone Encounter (Signed)
Received verbal orders for Cologuard.   Order placed via [employer].   Cologuard (Order [id number])

## 2019-08-12 ENCOUNTER — Encounter: Payer: Self-pay | Admitting: *Deleted

## 2019-08-24 ENCOUNTER — Encounter: Payer: Self-pay | Admitting: Family Medicine

## 2019-09-09 ENCOUNTER — Other Ambulatory Visit: Payer: Self-pay

## 2019-09-09 DIAGNOSIS — Z114 Encounter for screening for human immunodeficiency virus [HIV]: Secondary | ICD-10-CM

## 2019-09-09 NOTE — Progress Notes (Unsigned)
Patient called in requesting an HIV lab be drawn. Lab orders placed

## 2019-09-10 LAB — HIV ANTIBODY (ROUTINE TESTING W REFLEX): HIV 1&2 Ab, 4th Generation: NONREACTIVE

## 2019-09-13 ENCOUNTER — Encounter: Payer: Self-pay | Admitting: *Deleted

## 2020-02-21 ENCOUNTER — Encounter: Payer: Self-pay | Admitting: Nurse Practitioner

## 2020-02-21 ENCOUNTER — Ambulatory Visit (INDEPENDENT_AMBULATORY_CARE_PROVIDER_SITE_OTHER): Payer: Self-pay | Admitting: Nurse Practitioner

## 2020-02-21 ENCOUNTER — Other Ambulatory Visit: Payer: Self-pay

## 2020-02-21 VITALS — BP 128/70 | Temp 97.9°F | Ht 66.0 in | Wt 178.0 lb

## 2020-02-21 DIAGNOSIS — J00 Acute nasopharyngitis [common cold]: Secondary | ICD-10-CM

## 2020-02-21 DIAGNOSIS — B349 Viral infection, unspecified: Secondary | ICD-10-CM

## 2020-02-21 DIAGNOSIS — R509 Fever, unspecified: Secondary | ICD-10-CM

## 2020-02-21 DIAGNOSIS — R05 Cough: Secondary | ICD-10-CM

## 2020-02-21 DIAGNOSIS — R059 Cough, unspecified: Secondary | ICD-10-CM

## 2020-02-21 MED ORDER — FLUTICASONE PROPIONATE 50 MCG/ACT NA SUSP
2.0000 | Freq: Every day | NASAL | 6 refills | Status: DC
Start: 2020-02-21 — End: 2020-08-30

## 2020-02-21 NOTE — Progress Notes (Signed)
Established Patient Office Visit  Subjective:  Patient ID: Aaron Mcgee, male    DOB: October 11, 1965  Age: 54 y.o. MRN: [id number]  CC:  Chief Complaint  Patient presents with  . Fever    HPI Aaron Mcgee is a 54 year old male presenting for sxs of gradual onset of feeling feverish, mild sore throat, general body achyness fatique, chills, nasal congestion and clear drainage, decreased appetite that started Sunday. He report not being COVID vaccinated and desires to be tested concerned of having COVID. He reports staying in bed all day Monday feeling bad from stated sxs. And on Tues at 4 AM woke with chills and temp was 100.4. \He had one episode yesterday of diarrhea, loss taste or smell, denied sick contacts.  tx tried sudafed pe [facility] Mond q4 hrs prn 6pm mond / ibuprofen Mond 12pm.  No SOB, CP, CT, other GU, GI sxs, PAIN, edema, palpitation.   No past medical history on file.  No past surgical history on file.  Family History  Problem Relation Age of Onset  . Cancer Father        prostate 18  . Hypothyroidism Mother   . Hypertension Brother     Social History   Socioeconomic History  . Marital status: Married    Spouse name: Not on file  . Number of children: Not on file  . Years of education: Not on file  . Highest education level: Not on file  Occupational History  . Not on file  Tobacco Use  . Smoking status: Never Smoker  . Smokeless tobacco: Never Used  Vaping Use  . Vaping Use: Never used  Substance and Sexual Activity  . Alcohol use: No  . Drug use: No  . Sexual activity: Yes  Other Topics Concern  . Not on file  Social History Narrative  . Not on file   Social Determinants of Health   Financial Resource Strain:   . Difficulty of Paying Living Expenses:   Food Insecurity:   . Worried About Programme researcher, broadcasting/film/video in the Last Year:   . Barista in the Last Year:   Transportation Needs:   . Freight forwarder (Medical):   Marland Kitchen Lack  of Transportation (Non-Medical):   Physical Activity:   . Days of Exercise per Week:   . Minutes of Exercise per Session:   Stress:   . Feeling of Stress :   Social Connections:   . Frequency of Communication with Friends and Family:   . Frequency of Social Gatherings with Friends and Family:   . Attends Religious Services:   . Active Member of Clubs or Organizations:   . Attends Banker Meetings:   Marland Kitchen Marital Status:   Intimate Partner Violence:   . Fear of Current or Ex-Partner:   . Emotionally Abused:   Marland Kitchen Physically Abused:   . Sexually Abused:     Outpatient Medications Prior to Visit  Medication Sig Dispense Refill  . ibuprofen (ADVIL,MOTRIN) 200 MG tablet Take 400 mg by mouth every 6 (six) hours as needed (pain).    . sildenafil (REVATIO) 20 MG tablet TAKE UP TO 5 TABLETS BY MOUTH ONE TIME ONCE DAILY 50 tablet 1   No facility-administered medications prior to visit.    No Known Allergies  ROS Review of Systems  All other systems reviewed and are negative.     Objective:    Physical Exam Vitals and nursing note reviewed.  Constitutional:  General: He is not in acute distress.    Appearance: Normal appearance. He is well-developed and well-groomed. He is not ill-appearing, toxic-appearing or diaphoretic.  HENT:     Head: Normocephalic.     Nose: Congestion and rhinorrhea present.  Eyes:     Extraocular Movements: Extraocular movements intact.     Conjunctiva/sclera: Conjunctivae normal.     Pupils: Pupils are equal, round, and reactive to light.  Cardiovascular:     Rate and Rhythm: Normal rate and regular rhythm.  Pulmonary:     Effort: Pulmonary effort is normal.     Breath sounds: Normal breath sounds.  Abdominal:     General: Abdomen is flat. There is no distension.     Tenderness: There is no guarding.  Musculoskeletal:        General: Normal range of motion.     Cervical back: Normal range of motion and neck supple.   Lymphadenopathy:     Cervical: No cervical adenopathy.  Skin:    General: Skin is warm and dry.     Coloration: Skin is not jaundiced or pale.  Neurological:     General: No focal deficit present.     Mental Status: He is alert and oriented to person, place, and time.  Psychiatric:        Attention and Perception: Attention and perception normal.        Mood and Affect: Mood normal.        Speech: Speech normal.        Behavior: Behavior normal. Behavior is cooperative.        Thought Content: Thought content normal.        Cognition and Memory: Cognition normal.        Judgment: Judgment normal.     BP 128/70   Temp 97.9 F (36.6 C)   Ht 5\' 6"  (1.676 m)   Wt 178 lb (80.7 kg)   BMI 28.73 kg/m  Wt Readings from Last 3 Encounters:  02/21/20 178 lb (80.7 kg)  08/10/19 178 lb (80.7 kg)  10/07/17 161 lb 3.2 oz (73.1 kg)     Health Maintenance Due  Topic Date Due  . Hepatitis C Screening  Never done  . COLONOSCOPY  Never done  . INFLUENZA VACCINE  02/12/2020    There are no preventive care reminders to display for this patient.  Lab Results  Component Value Date   TSH 0.84 08/10/2019   Lab Results  Component Value Date   WBC 5.7 08/10/2019   HGB 15.0 08/10/2019   HCT 46.7 08/10/2019   MCV 86.5 08/10/2019   PLT 293 08/10/2019   Lab Results  Component Value Date   NA 137 08/10/2019   K 4.3 08/10/2019   CO2 29 08/10/2019   GLUCOSE 93 08/10/2019   BUN 14 08/10/2019   CREATININE 1.23 08/10/2019   BILITOT 0.6 08/10/2019   ALKPHOS 73 03/29/2017   AST 19 08/10/2019   ALT 28 08/10/2019   PROT 7.2 08/10/2019   ALBUMIN 3.9 03/29/2017   CALCIUM 9.5 08/10/2019   ANIONGAP 7 04/26/2017   Lab Results  Component Value Date   CHOL 189 08/10/2019   Lab Results  Component Value Date   HDL 73 08/10/2019   Lab Results  Component Value Date   LDLCALC 96 08/10/2019   Lab Results  Component Value Date   TRIG 108 08/10/2019   Lab Results  Component Value Date    CHOLHDL 2.6 08/10/2019   No results found  for: HGBA1C    Assessment & Plan:   Problem List Items Addressed This Visit    None    Visit Diagnoses    Cough with fever    -  Primary   Relevant Orders   SARS-COV-2 RNA,(COVID-19) QUAL NAAT   Viral illness       Acute rhinitis       Relevant Medications   fluticasone (FLONASE) 50 MCG/ACT nasal spray     Most consistent with Viral illness: May take OTC Vitamin C, Zinc on full stomach Get plenty of rest, drink plenty of fluids  May take over the counter medication for symptom relief such as guaifenesin, tylenol, ibuprofen, decongestant  Seek medical attention for new, worsening, or non resolving symptoms   Meds ordered this encounter  Medications  . fluticasone (FLONASE) 50 MCG/ACT nasal spray    Sig: Place 2 sprays into both nostrils daily.    Dispense:  16 g    Refill:  6    Follow-up: Return if symptoms worsen or fail to improve.    Elmore Guise, FNP

## 2020-02-21 NOTE — Patient Instructions (Signed)
Get plenty of rest, drink plenty of fluids  May take over the counter medication for symptom relief such as guaifenesin, tylenol, ibuprofen, decongestant  Seek medical attention for new, worsening, or non resolving symptoms

## 2020-02-22 LAB — SARS-COV-2 RNA,(COVID-19) QUALITATIVE NAAT: SARS CoV2 RNA: DETECTED — CR

## 2020-02-23 ENCOUNTER — Telehealth: Payer: Self-pay

## 2020-02-23 ENCOUNTER — Other Ambulatory Visit: Payer: Self-pay | Admitting: Nurse Practitioner

## 2020-02-23 NOTE — Progress Notes (Signed)
Positive for COVID. Please let pt know.

## 2020-02-23 NOTE — Telephone Encounter (Signed)
Spoke with Pt about his Covid results, he is aware he tested positive, and is aware he needs to [facility] for 10 to 14 days. He verbalized understanding of his results given. And asked after how many day after testing positive should he receive his Covid Vaccine.

## 2020-07-18 ENCOUNTER — Telehealth: Payer: Self-pay

## 2020-07-18 NOTE — Telephone Encounter (Signed)
Needs referral to have prostate exam. Patient would like the exam done in office here if possible or needs a referral. Please contact patient [phone]

## 2020-07-18 NOTE — Telephone Encounter (Signed)
Please let patient know this can be done in our office at his upcoming physical with Dr. Jeanice Lim.

## 2020-08-10 ENCOUNTER — Ambulatory Visit (INDEPENDENT_AMBULATORY_CARE_PROVIDER_SITE_OTHER): Payer: BC Managed Care – PPO | Admitting: Family Medicine

## 2020-08-10 ENCOUNTER — Other Ambulatory Visit: Payer: Self-pay

## 2020-08-10 ENCOUNTER — Encounter: Payer: Self-pay | Admitting: Family Medicine

## 2020-08-10 VITALS — BP 136/86 | HR 71 | Temp 97.3°F | Ht 66.0 in | Wt 175.0 lb

## 2020-08-10 DIAGNOSIS — Z1159 Encounter for screening for other viral diseases: Secondary | ICD-10-CM

## 2020-08-10 DIAGNOSIS — Z23 Encounter for immunization: Secondary | ICD-10-CM

## 2020-08-10 DIAGNOSIS — Z125 Encounter for screening for malignant neoplasm of prostate: Secondary | ICD-10-CM

## 2020-08-10 DIAGNOSIS — Z1211 Encounter for screening for malignant neoplasm of colon: Secondary | ICD-10-CM | POA: Diagnosis not present

## 2020-08-10 DIAGNOSIS — Z0000 Encounter for general adult medical examination without abnormal findings: Secondary | ICD-10-CM

## 2020-08-10 LAB — CBC WITH DIFFERENTIAL/PLATELET: Lymphs Abs: 2049 cells/uL (ref 850–3900)

## 2020-08-10 MED ORDER — SILDENAFIL CITRATE 20 MG PO TABS: ORAL_TABLET | ORAL | 2 refills | Status: DC

## 2020-08-10 NOTE — Progress Notes (Signed)
   Subjective:    Patient ID: Aaron Mcgee, male    DOB: 07/13/1966, 55 y.o.   MRN: [id number]  Patient presents for Annual Exam (Wants appt set up for colonoscopy)  Pt here for CPE Medications and history reveiewed  ED- using sildeanfil as needed  [facility] for routine visits  Dentist every 6 months  Colonoscopy - DUE   Taking calcicum/D/VITCAMIN C/Zinc   He is doing resistance training/weights a few times a week   Family history of prostate cancer- father, no urinary symptoms, no bowel issues    [facility] [address] - shinges vaccine    COVIDshots UTD    He had covid 19 infection in august  Review Of Systems:  GEN- denies fatigue, fever, weight loss,weakness, recent illness HEENT- denies eye drainage, change in vision, nasal discharge, CVS- denies chest pain, palpitations RESP- denies SOB, cough, wheeze ABD- denies N/V, change in stools, abd pain GU- denies dysuria, hematuria, dribbling, incontinence MSK- denies joint pain, muscle aches, injury Neuro- denies headache, dizziness, syncope, seizure activity       Objective:    BP 136/86   Pulse 71   Temp (!) 97.3 F (36.3 C)   Ht 5\' 6"  (1.676 m)   Wt 175 lb (79.4 kg)   SpO2 100%   BMI 28.25 kg/m  GEN- NAD, alert and oriented x3 HEENT- PERRL, EOMI, non injected sclera, pink conjunctiva, MMM, oropharynx clear Neck- Supple, no thyromegaly CVS- RRR, no murmur RESP-CTAB ABD-NABS,soft,NT,ND Psych normal affect and mood  EXT- No edema Pulses- Radial, DP- 2+        Assessment & Plan:     Discussed  exercise  Routine   Problem List Items Addressed This Visit   None   Visit Diagnoses    Routine general medical examination at a health care facility    -  Primary   CPE done, fasting labs, Hep C, referral to GI, TDAP given   Relevant Orders   CBC with Differential/Platelet   Comprehensive metabolic panel   Lipid panel   Prostate cancer screening       Relevant Orders   PSA   Colon cancer  screening       Relevant Orders   Ambulatory referral to Gastroenterology   Need for hepatitis C screening test       Relevant Orders   Hepatitis C antibody   Need for vaccination       Relevant Orders   Tdap vaccine greater than or equal to 7yo IM (Completed)      Note: This dictation was prepared with Dragon dictation along with smaller phrase technology. Any transcriptional errors that result from this process are unintentional.

## 2020-08-10 NOTE — Patient Instructions (Addendum)
F/U 1 year for Physical with [facility]  Referral for colonoscopy

## 2020-08-11 LAB — CBC WITH DIFFERENTIAL/PLATELET: MCHC: 31.9 g/dL — ABNORMAL LOW (ref 32.0–36.0)

## 2020-08-11 LAB — COMPREHENSIVE METABOLIC PANEL
AG Ratio: 1.5 (calc) (ref 1.0–2.5)
Sodium: 139 mmol/L (ref 135–146)
Total Protein: 6.9 g/dL (ref 6.1–8.1)

## 2020-08-13 LAB — COMPREHENSIVE METABOLIC PANEL
ALT: 21 U/L (ref 9–46)
AST: 15 U/L (ref 10–35)
Albumin: 4.1 g/dL (ref 3.6–5.1)
Alkaline phosphatase (APISO): 84 U/L (ref 35–144)
BUN: 8 mg/dL (ref 7–25)
CO2: 26 mmol/L (ref 20–32)
Calcium: 9.5 mg/dL (ref 8.6–10.3)
Chloride: 104 mmol/L (ref 98–110)
Creat: 0.93 mg/dL (ref 0.70–1.33)
Globulin: 2.8 g/dL (calc) (ref 1.9–3.7)
Glucose, Bld: 84 mg/dL (ref 65–99)
Potassium: 4.3 mmol/L (ref 3.5–5.3)
Total Bilirubin: 0.9 mg/dL (ref 0.2–1.2)

## 2020-08-13 LAB — LIPID PANEL
Cholesterol: 171 mg/dL (ref <200)
HDL: 70 mg/dL (ref >=40)
LDL Cholesterol (Calc): 87 mg/dL (calc)
Non-HDL Cholesterol (Calc): 101 mg/dL (calc) (ref <130)
Total CHOL/HDL Ratio: 2.4 (calc) (ref <5.0)
Triglycerides: 49 mg/dL (ref <150)

## 2020-08-13 LAB — CBC WITH DIFFERENTIAL/PLATELET
Absolute Monocytes: 456 cells/uL (ref 200–950)
Basophils Absolute: 19 cells/uL (ref 0–200)
Basophils Relative: 0.4 %
Eosinophils Absolute: 61 cells/uL (ref 15–500)
Eosinophils Relative: 1.3 %
HCT: 45.4 % (ref 38.5–50.0)
Hemoglobin: 14.5 g/dL (ref 13.2–17.1)
MCH: 27.9 pg (ref 27.0–33.0)
MCV: 87.3 fL (ref 80.0–100.0)
MPV: 9.2 fL (ref 7.5–12.5)
Monocytes Relative: 9.7 %
Neutro Abs: 2115 cells/uL (ref 1500–7800)
Neutrophils Relative %: 45 %
Platelets: 292 10*3/uL (ref 140–400)
RBC: 5.2 10*6/uL (ref 4.20–5.80)
RDW: 13 % (ref 11.0–15.0)
Total Lymphocyte: 43.6 %
WBC: 4.7 10*3/uL (ref 3.8–10.8)

## 2020-08-13 LAB — HEPATITIS C ANTIBODY
Hepatitis C Ab: NONREACTIVE
SIGNAL TO CUT-OFF: 0.28 (ref <1.00)

## 2020-08-13 LAB — PSA: PSA: 0.48 ng/mL (ref <=4.0)

## 2020-08-21 ENCOUNTER — Encounter: Payer: Self-pay | Admitting: Gastroenterology

## 2020-08-30 ENCOUNTER — Ambulatory Visit (AMBULATORY_SURGERY_CENTER): Payer: Self-pay | Admitting: *Deleted

## 2020-08-30 ENCOUNTER — Other Ambulatory Visit: Payer: Self-pay

## 2020-08-30 VITALS — Ht 66.0 in | Wt 175.0 lb

## 2020-08-30 DIAGNOSIS — Z1211 Encounter for screening for malignant neoplasm of colon: Secondary | ICD-10-CM

## 2020-08-30 NOTE — Progress Notes (Signed)
Patient is here in-person for PV. Patient denies any allergies to eggs or soy. Patient denies any problems with anesthesia/sedation. Patient denies any oxygen use at home. Patient denies taking any diet/weight loss medications or blood thinners. Patient is not being treated for MRSA or C-diff. Patient is aware of our care-partner policy and Covid-19 safety protocol. EMMI education assigned to the patient for the procedure, sent to MyChart.   COVID-19 vaccines completed x2, per patient.    

## 2020-09-06 ENCOUNTER — Encounter: Payer: Self-pay | Admitting: Gastroenterology

## 2020-09-11 ENCOUNTER — Ambulatory Visit (AMBULATORY_SURGERY_CENTER): Payer: BC Managed Care – PPO | Admitting: Gastroenterology

## 2020-09-11 ENCOUNTER — Encounter: Payer: Self-pay | Admitting: Gastroenterology

## 2020-09-11 ENCOUNTER — Other Ambulatory Visit: Payer: Self-pay

## 2020-09-11 VITALS — BP 129/88 | HR 80 | Temp 97.9°F | Resp 11 | Ht 66.0 in | Wt 175.0 lb

## 2020-09-11 DIAGNOSIS — Z1211 Encounter for screening for malignant neoplasm of colon: Secondary | ICD-10-CM | POA: Diagnosis not present

## 2020-09-11 MED ORDER — SODIUM CHLORIDE 0.9 % IV SOLN: 500.0000 mL | Freq: Once | INTRAVENOUS | Status: DC

## 2020-09-11 NOTE — Progress Notes (Signed)
Pt's states no medical or surgical changes since previsit or office visit.  CW - vitals 

## 2020-09-11 NOTE — Patient Instructions (Signed)
HANDOUTS given for diverticulosis and high fiber diet.  YOU HAD AN ENDOSCOPIC PROCEDURE TODAY AT THE [facility]:   Refer to the procedure report that was given to you for any specific questions about what was found during the examination.  If the procedure report does not answer your questions, please call your gastroenterologist to clarify.  If you requested that your care partner not be given the details of your procedure findings, then the procedure report has been included in a sealed envelope for you to review at your convenience later.  YOU SHOULD EXPECT: Some feelings of bloating in the abdomen. Passage of more gas than usual.  Walking can help get rid of the air that was put into your GI tract during the procedure and reduce the bloating. If you had a lower endoscopy (such as a colonoscopy or flexible sigmoidoscopy) you may notice spotting of blood in your stool or on the toilet paper. If you underwent a bowel prep for your procedure, you may not have a normal bowel movement for a few days.  Please Note:  You might notice some irritation and congestion in your nose or some drainage.  This is from the oxygen used during your procedure.  There is no need for concern and it should clear up in a day or so.  SYMPTOMS TO REPORT IMMEDIATELY:   Following lower endoscopy (colonoscopy or flexible sigmoidoscopy):  Excessive amounts of blood in the stool  Significant tenderness or worsening of abdominal pains  Swelling of the abdomen that is new, acute  Fever of 100F or higher  For urgent or emergent issues, a gastroenterologist can be reached at any hour by calling [phone]. Do not use MyChart messaging for urgent concerns.    DIET:  We do recommend a small meal at first, but then you may proceed to your regular diet.  Drink plenty of fluids but you should avoid alcoholic beverages for 24 hours.  ACTIVITY:  You should plan to take it easy for the rest of today and you should  NOT DRIVE or use heavy machinery until tomorrow (because of the sedation medicines used during the test).    FOLLOW UP: Our staff will call the number listed on your records Thursday AT 715AM-8AM  following your procedure to check on you and address any questions or concerns that you may have regarding the information given to you following your procedure. If we do not reach you, we will leave a message.  We will attempt to reach you two times.  During this call, we will ask if you have developed any symptoms of COVID 19. If you develop any symptoms (ie: fever, flu-like symptoms, shortness of breath, cough etc.) before then, please call [phone].  If you test positive for Covid 19 in the 2 weeks post procedure, please call and report this information to [place].    YOU WILL GET A REMINDER LETTER IN 10 YEARS WHEN YOUR COLONOSCOPY IS DUE.  Please call us at [phone] if you have not heard from [place] at that time.    SIGNATURES/CONFIDENTIALITY: You and/or your care partner have signed paperwork which will be entered into your electronic medical record.  These signatures attest to the fact that that the information above on your After Visit Summary has been reviewed and is understood.  Full responsibility of the confidentiality of this discharge information lies with you and/or your care-partner.

## 2020-09-11 NOTE — Progress Notes (Signed)
PT taken to PACU. Monitors in place. VSS. Report given to RN. 

## 2020-09-11 NOTE — Op Note (Signed)
[facility] Patient Name: Aaron Mcgee Procedure Date: 09/11/2020 9:02 AM MRN: [id number] Endoscopist: Justice Britain , MD Age: 55 Referring MD:  Date of Birth: [birth year] Gender: Male Account #: [id number] Procedure:                Colonoscopy Indications:              Screening for colorectal malignant neoplasm, This                            is the patient's first colonoscopy Medicines:                Monitored Anesthesia Care Procedure:                Pre-Anesthesia Assessment:                           - Prior to the procedure, a History and Physical                            was performed, and patient medications and                            allergies were reviewed. The patient's tolerance of                            previous anesthesia was also reviewed. The risks                            and benefits of the procedure and the sedation                            options and risks were discussed with the patient.                            All questions were answered, and informed consent                            was obtained. Prior Anticoagulants: The patient has                            taken no previous anticoagulant or antiplatelet                            agents except for NSAID medication. ASA Grade                            Assessment: I - A normal, healthy patient. After                            reviewing the risks and benefits, the patient was                            deemed in satisfactory condition to undergo the  procedure.                           After obtaining informed consent, the colonoscope                            was passed under direct vision. Throughout the                            procedure, the patient's blood pressure, pulse, and                            oxygen saturations were monitored continuously. The                            Olympus [id number]) [id number] was introduced                             through the anus and advanced to the 5 cm into the                            ileum. The colonoscopy was performed without                            difficulty. The patient tolerated the procedure.                            The quality of the bowel preparation was good. The                            terminal ileum, ileocecal valve, appendiceal                            orifice, and rectum were photographed. Scope In: 9:15:02 AM Scope Out: 9:29:51 AM Scope Withdrawal Time: 0 hours 10 minutes 56 seconds  Total Procedure Duration: 0 hours 14 minutes 48 seconds  Findings:                 The digital rectal exam findings include                            hemorrhoids. Pertinent negatives include no                            palpable rectal lesions.                           The terminal ileum and ileocecal valve appeared                            normal.                           A few small-mouthed diverticula were found in the  recto-sigmoid colon and sigmoid colon.                           Normal mucosa was found in the entire colon                            otherwise.                           Non-bleeding non-thrombosed internal hemorrhoids                            were found during retroflexion, during perianal                            exam and during digital exam. The hemorrhoids were                            Grade II (internal hemorrhoids that prolapse but                            reduce spontaneously). Complications:            No immediate complications. Estimated Blood Loss:     Estimated blood loss: none. Impression:               - Hemorrhoids found on digital rectal exam.                           - The examined portion of the ileum was normal.                           - Diverticulosis in the recto-sigmoid colon and in                            the sigmoid colon.                           - Normal mucosa in the  entire examined colon                            otherwise.                           - Non-bleeding non-thrombosed internal hemorrhoids. Recommendation:           - The patient will be observed post-procedure,                            until all discharge criteria are met.                           - Discharge patient to home.                           - Patient has a contact number available for  emergencies. The signs and symptoms of potential                            delayed complications were discussed with the                            patient. Return to normal activities tomorrow.                            Written discharge instructions were provided to the                            patient.                           - High fiber diet.                           - Use FiberCon 1-2 tablets PO daily.                           - Continue present medications.                           - Repeat colonoscopy in 10 years for screening                            purposes.                           - The findings and recommendations were discussed                            with the patient. Justice Britain, MD 09/11/2020 9:34:49 AM

## 2020-09-13 ENCOUNTER — Telehealth: Payer: Self-pay

## 2020-09-13 NOTE — Telephone Encounter (Signed)
  Follow up Call-  Call back number 09/11/2020  Post procedure Call Back phone  # [phone]  Permission to leave phone message Yes  Some recent data might be hidden     Patient questions:  Do you have a fever, pain , or abdominal swelling? No. Pain Score  0 *  Have you tolerated food without any problems? Yes.    Have you been able to return to your normal activities? Yes.    Do you have any questions about your discharge instructions: Diet   No. Medications  No. Follow up visit  No.  Do you have questions or concerns about your Care? No.  Actions: * If pain score is 4 or above: No action needed, pain <4.  1. Have you developed a fever since your procedure? no  2.   Have you had an respiratory symptoms (SOB or cough) since your procedure? no  3.   Have you tested positive for COVID 19 since your procedure no  4.   Have you had any family members/close contacts diagnosed with the COVID 19 since your procedure?  no   If yes to any of these questions please route to Laverna Peace, RN and Karlton Lemon, RN

## 2020-10-08 ENCOUNTER — Other Ambulatory Visit: Payer: Self-pay | Admitting: Family Medicine

## 2021-05-14 ENCOUNTER — Other Ambulatory Visit: Payer: Self-pay | Admitting: Family Medicine

## 2021-05-20 ENCOUNTER — Ambulatory Visit (INDEPENDENT_AMBULATORY_CARE_PROVIDER_SITE_OTHER): Payer: BC Managed Care – PPO | Admitting: Nurse Practitioner

## 2021-05-20 ENCOUNTER — Encounter: Payer: Self-pay | Admitting: Nurse Practitioner

## 2021-05-20 ENCOUNTER — Other Ambulatory Visit: Payer: Self-pay

## 2021-05-20 VITALS — BP 148/88 | HR 75 | Temp 98.6°F | Resp 18 | Ht 66.0 in | Wt 179.0 lb

## 2021-05-20 DIAGNOSIS — R030 Elevated blood-pressure reading, without diagnosis of hypertension: Secondary | ICD-10-CM | POA: Diagnosis not present

## 2021-05-20 DIAGNOSIS — N529 Male erectile dysfunction, unspecified: Secondary | ICD-10-CM | POA: Diagnosis not present

## 2021-05-20 MED ORDER — TADALAFIL 2.5 MG PO TABS: 2.5000 mg | ORAL_TABLET | Freq: Every day | ORAL | 0 refills | Status: DC

## 2021-05-20 NOTE — Progress Notes (Signed)
Subjective:    Patient ID: Aaron Mcgee, male    DOB: June 07, 1966, 55 y.o.   MRN: [id number]  HPI: Aaron Mcgee is a 55 y.o. male presenting for medication refill.  Chief Complaint  Patient presents with   Medication Refill    ED rx   ERECTILE DYSFUNCTION Patient reports sildenafil 20 mg used to work fine for him, however recently has not been as effective.  He has difficulty maintaining an erection to completion.  He is interested in trying tadalafil daily. Erectile Dysfunction status: Slightly worse Current treatment: Sildenafil Satisfied with current treatment: No Side effects: No Duration: years Struggles with maintaining erection: yes Fevers: no Hematuria: no Waking up at night to urinate: no Urinary incontinence: no Chest pain: no Shortness of breath: no  No Known Allergies  Outpatient Encounter Medications as of 05/20/2021  Medication Sig   CALCIUM PO Take by mouth.   Cholecalciferol (VITAMIN D3 PO) Take by mouth.   ibuprofen (ADVIL,MOTRIN) 200 MG tablet Take 400 mg by mouth every 6 (six) hours as needed (pain).   MAGNESIUM PO Take by mouth.   Multiple Vitamins-Minerals (ZINC PO) Take by mouth.   Tadalafil 2.5 MG TABS Take 1 tablet (2.5 mg total) by mouth daily.   UNABLE TO FIND Med Name: Sea Moss po   [DISCONTINUED] sildenafil (REVATIO) 20 MG tablet TAKE UP TO 5 TABLETS BY MOUTH ONCE DAILY   No facility-administered encounter medications on file as of 05/20/2021.    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 No pertinent past medical history.  Relevant past medical, surgical, family and social history reviewed and updated as indicated. Interim medical history since our last visit reviewed.  Review of Systems Per HPI unless specifically indicated above     Objective:    BP (!) 148/88 (BP Location: Left Arm, Patient Position: Sitting)    Pulse 75   Temp 98.6 F (37 C) (Oral)   Resp 18   Ht 5\' 6"  (1.676 m)   Wt 179 lb (81.2 kg)   SpO2 97%   BMI 28.89 kg/m   Wt Readings from Last 3 Encounters:  05/20/21 179 lb (81.2 kg)  09/11/20 175 lb (79.4 kg)  08/30/20 175 lb (79.4 kg)    Physical Exam Vitals and nursing note reviewed.  Constitutional:      General: He is not in acute distress.    Appearance: He is not toxic-appearing.  Cardiovascular:     Rate and Rhythm: Normal rate and regular rhythm.     Heart sounds: Normal heart sounds. No murmur heard. Pulmonary:     Effort: Pulmonary effort is normal. No respiratory distress.     Breath sounds: Normal breath sounds. No wheezing, rhonchi or rales.  Genitourinary:    Comments: deferred Skin:    General: Skin is warm and dry.     Coloration: Skin is not jaundiced or pale.     Findings: No erythema.  Neurological:     Mental Status: He is alert and oriented to person, place, and time.  Psychiatric:        Mood and Affect: Mood normal.        Behavior: Behavior normal.        Thought Content: Thought content normal.        Judgment: Judgment normal.      Assessment & Plan:   Problem List Items  Addressed This Visit       Other   Erectile dysfunction - Primary    Chronic.  Patient does not have any new lower urinary tract symptoms.  We discussed options including sildenafil as needed, low-dose tadalafil 2.5 mg daily.  Patient elects to proceed with tadalafil 2.5 mg daily.  Good Rx coupon given -patient prefers [facility] for this medication.  Patient will let me know how this works for him.  We can always switch back to sildenafil if needed.  Not due yet for PSA- physical to be scheduled in January.      Relevant Medications   Tadalafil 2.5 MG TABS   Other Visit Diagnoses     Elevated blood pressure reading in office without diagnosis of hypertension       Recheck at next office visit-will be physical in January.  Blood pressure is not usually elevated.  He was  rushing to get here today.        Follow up plan: Return CPE after 1/28.

## 2021-05-21 NOTE — Assessment & Plan Note (Signed)
Chronic.  Patient does not have any new lower urinary tract symptoms.  We discussed options including sildenafil as needed, low-dose tadalafil 2.5 mg daily.  Patient elects to proceed with tadalafil 2.5 mg daily.  Good Rx coupon given -patient prefers [facility] for this medication.  Patient will let me know how this works for him.  We can always switch back to sildenafil if needed.  Not due yet for PSA- physical to be scheduled in January.

## 2021-05-29 ENCOUNTER — Telehealth: Payer: Self-pay

## 2021-05-29 ENCOUNTER — Other Ambulatory Visit: Payer: Self-pay | Admitting: Nurse Practitioner

## 2021-05-29 DIAGNOSIS — N529 Male erectile dysfunction, unspecified: Secondary | ICD-10-CM

## 2021-05-29 MED ORDER — SILDENAFIL CITRATE 20 MG PO TABS: ORAL_TABLET | ORAL | 0 refills | Status: DC

## 2021-05-29 NOTE — Telephone Encounter (Signed)
Spoke with pt and gave recommendations. Nothing further needed.

## 2021-05-29 NOTE — Telephone Encounter (Signed)
Please let patient know he can try taking 2 of the 2.5 mg tablets (5 mg) of Tadalafil daily.  I sent sildenafil 20 mg into pharmacy in case that does not work.

## 2021-05-29 NOTE — Telephone Encounter (Signed)
LVM for pt to return call

## 2021-05-29 NOTE — Telephone Encounter (Signed)
Pt called to advise he attempted to send a My Chart message and did not receive a response. Per chart there are no current incoming messages, advised pt not sure what happened to his message. Told pt I would send him a "test" My Chart message once response received for this request.   Pt states new rx of Tadalafil is not working for him. He states you had advised he could let you know if this worked or not, and if not you would put him back on his previous ED medication.   Pt would like rx sent to [facility].  Please advise, thanks!

## 2021-08-06 ENCOUNTER — Ambulatory Visit: Payer: BC Managed Care – PPO | Admitting: Registered Nurse

## 2021-08-15 ENCOUNTER — Ambulatory Visit (INDEPENDENT_AMBULATORY_CARE_PROVIDER_SITE_OTHER): Payer: BLUE CROSS/BLUE SHIELD | Admitting: Nurse Practitioner

## 2021-08-15 ENCOUNTER — Encounter: Payer: Self-pay | Admitting: Nurse Practitioner

## 2021-08-15 ENCOUNTER — Other Ambulatory Visit: Payer: Self-pay

## 2021-08-15 VITALS — BP 120/80 | HR 100 | Ht 66.0 in | Wt 173.0 lb

## 2021-08-15 DIAGNOSIS — Z131 Encounter for screening for diabetes mellitus: Secondary | ICD-10-CM | POA: Diagnosis not present

## 2021-08-15 DIAGNOSIS — Z13220 Encounter for screening for lipoid disorders: Secondary | ICD-10-CM

## 2021-08-15 DIAGNOSIS — Z0000 Encounter for general adult medical examination without abnormal findings: Secondary | ICD-10-CM | POA: Diagnosis not present

## 2021-08-15 DIAGNOSIS — N529 Male erectile dysfunction, unspecified: Secondary | ICD-10-CM

## 2021-08-15 DIAGNOSIS — Z136 Encounter for screening for cardiovascular disorders: Secondary | ICD-10-CM

## 2021-08-15 DIAGNOSIS — Z125 Encounter for screening for malignant neoplasm of prostate: Secondary | ICD-10-CM

## 2021-08-15 DIAGNOSIS — Z130 Encounter for screening for diseases of the blood and blood-forming organs and certain disorders involving the immune mechanism: Secondary | ICD-10-CM | POA: Diagnosis not present

## 2021-08-15 DIAGNOSIS — Z1329 Encounter for screening for other suspected endocrine disorder: Secondary | ICD-10-CM

## 2021-08-15 DIAGNOSIS — Z114 Encounter for screening for human immunodeficiency virus [HIV]: Secondary | ICD-10-CM

## 2021-08-15 DIAGNOSIS — Z13228 Encounter for screening for other metabolic disorders: Secondary | ICD-10-CM

## 2021-08-15 MED ORDER — SILDENAFIL CITRATE 20 MG PO TABS: ORAL_TABLET | ORAL | 0 refills | Status: DC

## 2021-08-15 NOTE — Assessment & Plan Note (Signed)
Chronic.  No lower urinary tract symptoms.  Continue sildenafil 20 mg daily as needed.  PSA to be checked today.

## 2021-08-15 NOTE — Progress Notes (Signed)
BP 120/80    Pulse 100    Ht 5\' 6"  (1.676 m)    Wt 173 lb (78.5 kg)    SpO2 97%    BMI 27.92 kg/m    Subjective:    Patient ID: Aaron Mcgee, male    DOB: 30-Apr-1966, 56 y.o.   MRN: [id number]  HPI: Aaron Mcgee is a 56 y.o. male presenting on 08/15/2021 for comprehensive medical examination. Current medical complaints include: none  Activity - [place]    Denies any new urinary symptoms including no increased nocturnal frequency, urgency, hematuria, and incontinence.  His father was diagnosed with prostate cancer in his 34s and he has been screened since age 31.  Interim Problems from his last visit: no  Depression Screen done today and results listed below:  Depression screen [facility] 2/9 08/15/2021 08/10/2020 08/10/2019 10/07/2017 10/02/2016  Decreased Interest 0 0 0 0 0  Down, Depressed, Hopeless 0 0 0 0 0  PHQ - 2 Score 0 0 0 0 0  Altered sleeping 0 - - - 0  Tired, decreased energy 0 - - - 0  Change in appetite 0 - - - 0  Feeling bad or failure about yourself  0 - - - 0  Trouble concentrating 0 - - - 0  Moving slowly or fidgety/restless 0 - - - 0  Suicidal thoughts 0 - - - 0  PHQ-9 Score 0 - - - 0  Difficult doing work/chores - - - - Not difficult at all    The patient does not have a history of falls. I did not complete a risk assessment for falls. A plan of care for falls was not documented.   Past Medical History:  No past medical history on file.  Surgical History:  Past Surgical History:  Procedure Laterality Date   WISDOM TOOTH EXTRACTION      Medications:  Current Outpatient Medications on File Prior to Visit  Medication Sig   CALCIUM PO Take by mouth.   Cholecalciferol (VITAMIN D3 PO) Take by mouth.   ibuprofen (ADVIL,MOTRIN) 200 MG tablet Take 400 mg by mouth every 6 (six) hours as needed (pain).   MAGNESIUM PO Take by mouth.   Multiple Vitamins-Minerals (ZINC PO) Take by mouth.   UNABLE TO FIND Med Name: Jani Gravel po    No current facility-administered medications on file prior to visit.    Allergies:  No Known Allergies  Social History:  Social History   Socioeconomic History   Marital status: Married    Spouse name: Not on file   Number of children: Not on file   Years of education: Not on file   Highest education level: Not on file  Occupational History   Not on file  Tobacco Use   Smoking status: Never   Smokeless tobacco: Never  Vaping Use   Vaping Use: Never used  Substance and Sexual Activity   Alcohol use: Not Currently   Drug use: No   Sexual activity: Not Currently  Other Topics Concern   Not on file  Social History Narrative   Not on file   Social Determinants of Health   Financial Resource Strain: Not on file  Food Insecurity: Not on file  Transportation Needs: Not on file  Physical Activity: Not on file  Stress: Not on file  Social Connections: Not on file  Intimate Partner Violence: Not on file   Social History   Tobacco Use  Smoking  Status Never  Smokeless Tobacco Never   Social History   Substance and Sexual Activity  Alcohol Use Not Currently    Family History:  Family History  Problem Relation Age of Onset   Cancer Father        prostate 41   Hypothyroidism Mother    Arthritis Mother    Hypertension Brother    Colon cancer Neg Hx    Colon polyps Neg Hx    Esophageal cancer Neg Hx    Rectal cancer Neg Hx    Stomach cancer Neg Hx     Past medical history, surgical history, medications, allergies, family history and social history reviewed with patient today and changes made to appropriate areas of the chart.   Review of Systems  Constitutional: Negative.   HENT: Negative.    Eyes: Negative.   Respiratory: Negative.    Cardiovascular: Negative.   Gastrointestinal: Negative.   Genitourinary: Negative.   Musculoskeletal: Negative.   Skin: Negative.   Neurological: Negative.   Psychiatric/Behavioral: Negative.        Objective:     BP 120/80    Pulse 100    Ht 5\' 6"  (1.676 m)    Wt 173 lb (78.5 kg)    SpO2 97%    BMI 27.92 kg/m   Wt Readings from Last 3 Encounters:  08/15/21 173 lb (78.5 kg)  05/20/21 179 lb (81.2 kg)  09/11/20 175 lb (79.4 kg)    Physical Exam Constitutional:      General: He is not in acute distress.    Appearance: Normal appearance. He is normal weight. He is not toxic-appearing.  HENT:     Head: Normocephalic and atraumatic.     Right Ear: Tympanic membrane, ear canal and external ear normal.     Left Ear: Tympanic membrane, ear canal and external ear normal.     Nose: Nose normal. No congestion.     Mouth/Throat:     Mouth: Mucous membranes are moist.     Pharynx: Oropharynx is clear. No oropharyngeal exudate or posterior oropharyngeal erythema.  Eyes:     General: No scleral icterus.    Extraocular Movements: Extraocular movements intact.     Conjunctiva/sclera: Conjunctivae normal.     Pupils: Pupils are equal, round, and reactive to light.  Neck:     Vascular: No carotid bruit.  Cardiovascular:     Rate and Rhythm: Normal rate and regular rhythm.     Heart sounds: Normal heart sounds. No murmur heard.   No gallop.  Pulmonary:     Effort: Pulmonary effort is normal. No respiratory distress.     Breath sounds: Normal breath sounds. No wheezing or rhonchi.  Abdominal:     General: Abdomen is flat. Bowel sounds are normal. There is no distension.     Palpations: Abdomen is soft.     Tenderness: There is no abdominal tenderness.  Genitourinary:    Comments: Deferred Musculoskeletal:        General: No swelling or tenderness. Normal range of motion.     Cervical back: Normal range of motion and neck supple. No tenderness.     Right lower leg: No edema.     Left lower leg: No edema.  Skin:    General: Skin is warm and dry.     Coloration: Skin is not jaundiced or pale.     Findings: No erythema or lesion.  Neurological:     General: No focal deficit present.  Mental  Status: He is alert and oriented to person, place, and time.     Sensory: No sensory deficit.     Motor: No weakness.     Coordination: Coordination normal.     Gait: Gait normal.  Psychiatric:        Mood and Affect: Mood normal.        Behavior: Behavior normal.        Thought Content: Thought content normal.        Judgment: Judgment normal.      Assessment & Plan:   Problem List Items Addressed This Visit       Other   Erectile dysfunction    Chronic.  No lower urinary tract symptoms.  Continue sildenafil 20 mg daily as needed.  PSA to be checked today.      Relevant Medications   sildenafil (REVATIO) 20 MG tablet   Other Visit Diagnoses     Annual physical exam    -  Primary   Prostate cancer screening       Relevant Orders   PSA   Encounter for lipid screening for cardiovascular disease       Relevant Orders   Lipid panel   Screening for iron deficiency anemia       Relevant Orders   CBC with Differential/Platelet   Screening for thyroid disorder       Screening for metabolic disorder       Relevant Orders   COMPLETE METABOLIC PANEL WITH GFR   Encounter for screening for HIV       Relevant Orders   HIV Antibody (routine testing w rflx)   Screening for diabetes mellitus       Relevant Orders   Hemoglobin A1c        Discussed aspirin prophylaxis for myocardial infarction prevention and decision was it was not indicated  LABORATORY TESTING:  Health maintenance labs ordered today as discussed above.   The natural history of prostate cancer and ongoing controversy regarding screening and potential treatment outcomes of prostate cancer has been discussed with the patient. The meaning of a false positive PSA and a false negative PSA has been discussed. He indicates understanding of the limitations of this screening test and wishes to proceed with screening PSA testing.   IMMUNIZATIONS:   - Tdap: Tetanus vaccination status reviewed: last tetanus booster  within 10 years. - Influenza: Refused - Pneumovax: Not applicable - Prevnar: Not applicable - HPV: Not applicable - Zostavax vaccine: Up to date - COVID-19 vaccine: has had 2 doses and declines boosters  SCREENING: - Colonoscopy: Up to date  Discussed with patient purpose of the colonoscopy is to detect colon cancer at curable precancerous or early stages   - AAA Screening: Not applicable  -Hearing Test: Not applicable  -Spirometry: Not applicable   Follow up plan: NEXT PREVENTATIVE PHYSICAL DUE IN 1 YEAR. Return for with new PCP.

## 2021-08-16 LAB — CBC WITH DIFFERENTIAL/PLATELET
Absolute Monocytes: 586 cells/uL (ref 200–950)
Basophils Absolute: 19 cells/uL (ref 0–200)
Basophils Relative: 0.4 %
Eosinophils Absolute: 29 cells/uL (ref 15–500)
Eosinophils Relative: 0.6 %
HCT: 45.4 % (ref 38.5–50.0)
Hemoglobin: 14.4 g/dL (ref 13.2–17.1)
Lymphs Abs: 1824 cells/uL (ref 850–3900)
MCH: 27.7 pg (ref 27.0–33.0)
MCHC: 31.7 g/dL — ABNORMAL LOW (ref 32.0–36.0)
MCV: 87.3 fL (ref 80.0–100.0)
MPV: 10.7 fL (ref 7.5–12.5)
Monocytes Relative: 12.2 %
Neutro Abs: 2342 cells/uL (ref 1500–7800)
Neutrophils Relative %: 48.8 %
Platelets: 285 10*3/uL (ref 140–400)
RBC: 5.2 10*6/uL (ref 4.20–5.80)
RDW: 12.9 % (ref 11.0–15.0)
Total Lymphocyte: 38 %
WBC: 4.8 10*3/uL (ref 3.8–10.8)

## 2021-08-16 LAB — PSA: PSA: 0.46 ng/mL (ref <=4.00)

## 2021-08-16 LAB — LIPID PANEL
Cholesterol: 163 mg/dL (ref <200)
HDL: 77 mg/dL (ref >=40)
LDL Cholesterol (Calc): 74 mg/dL (calc)
Non-HDL Cholesterol (Calc): 86 mg/dL (calc) (ref <130)
Total CHOL/HDL Ratio: 2.1 (calc) (ref <5.0)
Triglycerides: 42 mg/dL (ref <150)

## 2021-08-16 LAB — COMPLETE METABOLIC PANEL WITH GFR
AG Ratio: 1.4 (calc) (ref 1.0–2.5)
ALT: 29 U/L (ref 9–46)
AST: 18 U/L (ref 10–35)
Albumin: 4.3 g/dL (ref 3.6–5.1)
Alkaline phosphatase (APISO): 81 U/L (ref 35–144)
BUN: 11 mg/dL (ref 7–25)
CO2: 28 mmol/L (ref 20–32)
Calcium: 9.8 mg/dL (ref 8.6–10.3)
Chloride: 101 mmol/L (ref 98–110)
Creat: 1.08 mg/dL (ref 0.70–1.30)
Globulin: 3 g/dL (calc) (ref 1.9–3.7)
Glucose, Bld: 93 mg/dL (ref 65–99)
Potassium: 5 mmol/L (ref 3.5–5.3)
Sodium: 136 mmol/L (ref 135–146)
Total Bilirubin: 0.9 mg/dL (ref 0.2–1.2)
Total Protein: 7.3 g/dL (ref 6.1–8.1)
eGFR: 82 mL/min/{1.73_m2} (ref >=60)

## 2021-08-16 LAB — HEMOGLOBIN A1C
Hgb A1c MFr Bld: 5.4 % of total Hgb (ref <5.7)
Mean Plasma Glucose: 108 mg/dL
eAG (mmol/L): 6 mmol/L

## 2021-08-16 LAB — HIV ANTIBODY (ROUTINE TESTING W REFLEX): HIV 1&2 Ab, 4th Generation: NONREACTIVE

## 2021-09-20 ENCOUNTER — Ambulatory Visit: Payer: Self-pay | Admitting: Registered Nurse

## 2021-10-07 ENCOUNTER — Telehealth: Payer: Self-pay | Admitting: Nurse Practitioner

## 2021-10-07 NOTE — Telephone Encounter (Signed)
Received call from patient to follow up on bill received for cpe, dos 08/15/2021. Patient requesting call back; bill received for $633.93. ? ?Additional lab tests taken; patient wants to know how visit was coded and which items on bill are considered preventative. Patient will call insurance company to find out what tests are included in a cpe and call back to let us know how bill needs to be resubmitted for [place] to receive payment from them. ? ?Please advise at [phone] ?

## 2021-10-10 ENCOUNTER — Ambulatory Visit (INDEPENDENT_AMBULATORY_CARE_PROVIDER_SITE_OTHER): Payer: Self-pay | Admitting: Registered Nurse

## 2021-10-10 ENCOUNTER — Encounter: Payer: Self-pay | Admitting: Registered Nurse

## 2021-10-10 DIAGNOSIS — N529 Male erectile dysfunction, unspecified: Secondary | ICD-10-CM

## 2021-10-10 MED ORDER — SILDENAFIL CITRATE 20 MG PO TABS: ORAL_TABLET | ORAL | 11 refills | Status: DC

## 2021-10-10 NOTE — Telephone Encounter (Signed)
I have reviewed patients account and I don't see where patient owes anything to our office.  ? ?If this is a [facility] bill please get all the information from patient so I can send msg to [facility] about bill. ? ?We will need invoice # ?Billed Amt  ?Date of Service ? ?I have left msg for patient to return my call if he calls back please advise him of this message and get additional information if it is for [facility]. ?

## 2021-10-10 NOTE — Patient Instructions (Signed)
Mr. Villwock -  ? ?Great to meet you ? ?Call with concerns ? ?See you annually for physical and labs ? ?Thanks, ? ?Rich  ?

## 2021-10-10 NOTE — Progress Notes (Signed)
? ?New Patient Office Visit ? ?Subjective:  ?Patient ID: Aaron Mcgee, male    DOB: 22-Apr-1966  Age: 56 y.o. MRN: [id number] ? ?CC:  ?Chief Complaint  ?Patient presents with  ? New Patient (Initial Visit)  ?  Patient states he is here to establish care. Patient states he has no concerns.  ? ? ?HPI ?Darene Lamer presents to establish care ? ?ED: ?Sildenafil 50mg  po qd prn ?Good effect, no AE ?Needs refill ? ?Otherwise, no acute concerns ? ?Histories reviewed and updated with patient.  ? ? ?Outpatient Encounter Medications as of 10/10/2021  ?Medication Sig  ? CALCIUM PO Take by mouth.  ? Cholecalciferol (VITAMIN D3 PO) Take by mouth.  ? ibuprofen (ADVIL,MOTRIN) 200 MG tablet Take 400 mg by mouth every 6 (six) hours as needed (pain).  ? MAGNESIUM PO Take by mouth.  ? Multiple Vitamins-Minerals (ZINC PO) Take by mouth.  ? UNABLE TO FIND Med Name: [place] po  ? [DISCONTINUED] sildenafil (REVATIO) 20 MG tablet TAKE UP TO 5 TABLETS BY MOUTH ONCE DAILY AS NEEDED FOR ERECTILE DYSFUNCTION  ? sildenafil (REVATIO) 20 MG tablet TAKE UP TO 5 TABLETS BY MOUTH ONCE DAILY AS NEEDED FOR ERECTILE DYSFUNCTION  ? ?No facility-administered encounter medications on file as of 10/10/2021.  ? ? ?History reviewed. No pertinent past medical history. ? ?Past Surgical History:  ?Procedure Laterality Date  ? WISDOM TOOTH EXTRACTION    ? ? ?Family History  ?Problem Relation Age of Onset  ? Cancer Father   ?     prostate 63  ? Hypothyroidism Mother   ? Arthritis Mother   ? Hypertension Brother   ? Colon cancer Neg Hx   ? Colon polyps Neg Hx   ? Esophageal cancer Neg Hx   ? Rectal cancer Neg Hx   ? Stomach cancer Neg Hx   ? ? ?Social History  ? ?Socioeconomic History  ? Marital status: Not on file  ?  Spouse name: Not on file  ? Number of children: 2  ? Years of education: Not on file  ? Highest education level: Not on file  ?Occupational History  ? Not on file  ?Tobacco Use  ? Smoking status: Never  ? Smokeless tobacco: Never  ?Vaping  Use  ? Vaping Use: Never used  ?Substance and Sexual Activity  ? Alcohol use: Not Currently  ? Drug use: No  ? Sexual activity: Yes  ?  Birth control/protection: Condom  ?Other Topics Concern  ? Not on file  ?Social History Narrative  ? Not on file  ? ?Social Determinants of Health  ? ?Financial Resource Strain: Not on file  ?Food Insecurity: Not on file  ?Transportation Needs: Not on file  ?Physical Activity: Not on file  ?Stress: Not on file  ?Social Connections: Not on file  ?Intimate Partner Violence: Not on file  ? ? ?ROS ?Review of Systems  ?Constitutional: Negative.   ?HENT: Negative.    ?Eyes: Negative.   ?Respiratory: Negative.    ?Cardiovascular: Negative.   ?Gastrointestinal: Negative.   ?Genitourinary: Negative.   ?Musculoskeletal: Negative.   ?Skin: Negative.   ?Neurological: Negative.   ?Psychiatric/Behavioral: Negative.    ?All other systems reviewed and are negative. ? ?Objective:  ? ?Today's Vitals: BP 128/83   Pulse 70   Temp 98.1 ?F (36.7 ?C) (Temporal)   Resp 18   Ht 5\' 6"  (1.676 m)   Wt 175 lb (79.4 kg)   SpO2 100%   BMI 28.25 kg/m?  ? ?  Physical Exam ?Constitutional:   ?   General: He is not in acute distress. ?   Appearance: Normal appearance. He is normal weight. He is not ill-appearing, toxic-appearing or diaphoretic.  ?Cardiovascular:  ?   Rate and Rhythm: Normal rate and regular rhythm.  ?   Heart sounds: Normal heart sounds. No murmur heard. ?  No friction rub. No gallop.  ?Pulmonary:  ?   Effort: Pulmonary effort is normal. No respiratory distress.  ?   Breath sounds: Normal breath sounds. No stridor. No wheezing, rhonchi or rales.  ?Chest:  ?   Chest wall: No tenderness.  ?Neurological:  ?   General: No focal deficit present.  ?   Mental Status: He is alert and oriented to person, place, and time. Mental status is at baseline.  ?Psychiatric:     ?   Mood and Affect: Mood normal.     ?   Behavior: Behavior normal.     ?   Thought Content: Thought content normal.     ?   Judgment:  Judgment normal.  ? ? ? ? ? ? ?Assessment & Plan:  ? ?Problem List Items Addressed This Visit   ? ?  ? Other  ? Erectile dysfunction  ? Relevant Medications  ? sildenafil (REVATIO) 20 MG tablet  ? ? ?Follow-up: Return in about 1 year (around 10/11/2022) for CPE and labs.  ? ?PLAN ?Refill sildenafil ?Return annually for CPE and labs ?Patient encouraged to call clinic with any questions, comments, or concerns. ? ?Janeece Agee, NP ?

## 2022-10-15 ENCOUNTER — Encounter: Payer: Self-pay | Admitting: Registered Nurse

## 2022-12-10 NOTE — Progress Notes (Unsigned)
   New Patient Office Visit  Subjective    Patient ID: Aaron Mcgee, male    DOB: [birth year]  Age: 57 y.o. MRN: [id number]  CC: No chief complaint on file.   HPI Aaron Mcgee presents to establish care with new provider.   Patients previous primary care provider was Aaron Agee, NP. Last visit was 10/10/2021.  Specialist:   Erectile Dysfunction: Patient is taking Sildenafil 20mg , take up to 5 tablets once daily as needed for erectile dysfunction.    Outpatient Encounter Medications as of 12/11/2022  Medication Sig   CALCIUM PO Take by mouth.   Cholecalciferol (VITAMIN D3 PO) Take by mouth.   ibuprofen (ADVIL,MOTRIN) 200 MG tablet Take 400 mg by mouth every 6 (six) hours as needed (pain).   MAGNESIUM PO Take by mouth.   Multiple Vitamins-Minerals (ZINC PO) Take by mouth.   sildenafil (REVATIO) 20 MG tablet TAKE UP TO 5 TABLETS BY MOUTH ONCE DAILY AS NEEDED FOR ERECTILE DYSFUNCTION   UNABLE TO FIND Med Name: [place] po   No facility-administered encounter medications on file as of 12/11/2022.    No past medical history on file.  Past Surgical History:  Procedure Laterality Date   WISDOM TOOTH EXTRACTION      Family History  Problem Relation Age of Onset   Cancer Father        prostate 59   Hypothyroidism Mother    Arthritis Mother    Hypertension Brother    Colon cancer Neg Hx    Colon polyps Neg Hx    Esophageal cancer Neg Hx    Rectal cancer Neg Hx    Stomach cancer Neg Hx     Social History   Socioeconomic History   Marital status: Not on file    Spouse name: Not on file   Number of children: 2   Years of education: Not on file   Highest education level: Not on file  Occupational History   Not on file  Tobacco Use   Smoking status: Never   Smokeless tobacco: Never  Vaping Use   Vaping Use: Never used  Substance and Sexual Activity   Alcohol use: Not Currently   Drug use: No   Sexual activity: Yes    Birth control/protection: Condom   Other Topics Concern   Not on file  Social History Narrative   Not on file   Social Determinants of Health   Financial Resource Strain: Not on file  Food Insecurity: Not on file  Transportation Needs: Not on file  Physical Activity: Not on file  Stress: Not on file  Social Connections: Not on file  Intimate Partner Violence: Not on file    ROS See HPI above    Objective    There were no vitals taken for this visit.  Physical Exam    Assessment & Plan:  There are no diagnoses linked to this encounter.  1.Review health maintenance:  -Declines covid booster No follow-ups on file.   Zandra Abts, NP

## 2022-12-11 ENCOUNTER — Ambulatory Visit (INDEPENDENT_AMBULATORY_CARE_PROVIDER_SITE_OTHER): Payer: BC Managed Care – PPO | Admitting: Family Medicine

## 2022-12-11 ENCOUNTER — Encounter: Payer: Self-pay | Admitting: Family Medicine

## 2022-12-11 VITALS — BP 128/90 | HR 77 | Temp 98.9°F | Ht 66.0 in | Wt 176.5 lb

## 2022-12-11 DIAGNOSIS — Z13220 Encounter for screening for lipoid disorders: Secondary | ICD-10-CM

## 2022-12-11 DIAGNOSIS — Z7689 Persons encountering health services in other specified circumstances: Secondary | ICD-10-CM

## 2022-12-11 DIAGNOSIS — N529 Male erectile dysfunction, unspecified: Secondary | ICD-10-CM | POA: Diagnosis not present

## 2022-12-11 DIAGNOSIS — Z130 Encounter for screening for diseases of the blood and blood-forming organs and certain disorders involving the immune mechanism: Secondary | ICD-10-CM

## 2022-12-11 DIAGNOSIS — Z13228 Encounter for screening for other metabolic disorders: Secondary | ICD-10-CM

## 2022-12-11 DIAGNOSIS — Z125 Encounter for screening for malignant neoplasm of prostate: Secondary | ICD-10-CM | POA: Diagnosis not present

## 2022-12-11 DIAGNOSIS — Z1329 Encounter for screening for other suspected endocrine disorder: Secondary | ICD-10-CM

## 2022-12-11 MED ORDER — SILDENAFIL CITRATE 20 MG PO TABS
ORAL_TABLET | ORAL | 11 refills | Status: DC
Start: 2022-12-11 — End: 2023-12-14

## 2022-12-11 NOTE — Assessment & Plan Note (Signed)
Stable. Effective. Refilled Sildenafil.

## 2022-12-11 NOTE — Patient Instructions (Signed)
It was a pleasure to meet you today and look forward to taking care of you  -Refilled Sildenafil.  -Ordered only PSA at your request. Office will call with lab results and you may see them on MyChart. -Follow up in 1 year for physical. If you decide at that time, you want your cholesterol checked, please be fasting.

## 2022-12-12 ENCOUNTER — Telehealth: Payer: Self-pay

## 2022-12-12 LAB — PSA: PSA: 0.63 ng/mL (ref 0.10–4.00)

## 2022-12-12 NOTE — Telephone Encounter (Signed)
-----   Message from Alveria Apley, NP sent at 12/12/2022  1:44 PM EDT ----- Your PSA is normal.

## 2023-12-14 ENCOUNTER — Encounter: Payer: Self-pay | Admitting: Family Medicine

## 2023-12-14 ENCOUNTER — Ambulatory Visit (INDEPENDENT_AMBULATORY_CARE_PROVIDER_SITE_OTHER): Payer: BC Managed Care – PPO | Admitting: Family Medicine

## 2023-12-14 ENCOUNTER — Ambulatory Visit: Payer: Self-pay | Admitting: Family Medicine

## 2023-12-14 VITALS — BP 148/90 | HR 80 | Temp 98.2°F | Ht 67.5 in | Wt 179.0 lb

## 2023-12-14 DIAGNOSIS — E663 Overweight: Secondary | ICD-10-CM | POA: Diagnosis not present

## 2023-12-14 DIAGNOSIS — Z125 Encounter for screening for malignant neoplasm of prostate: Secondary | ICD-10-CM | POA: Diagnosis not present

## 2023-12-14 DIAGNOSIS — Z8042 Family history of malignant neoplasm of prostate: Secondary | ICD-10-CM | POA: Diagnosis not present

## 2023-12-14 DIAGNOSIS — Z0000 Encounter for general adult medical examination without abnormal findings: Secondary | ICD-10-CM | POA: Diagnosis not present

## 2023-12-14 DIAGNOSIS — N529 Male erectile dysfunction, unspecified: Secondary | ICD-10-CM

## 2023-12-14 LAB — CBC WITH DIFFERENTIAL/PLATELET
Basophils Absolute: 0 10*3/uL (ref 0.0–0.1)
Basophils Relative: 0.8 % (ref 0.0–3.0)
Eosinophils Absolute: 0.1 10*3/uL (ref 0.0–0.7)
Eosinophils Relative: 1.2 % (ref 0.0–5.0)
HCT: 44.5 % (ref 39.0–52.0)
Hemoglobin: 14.5 g/dL (ref 13.0–17.0)
Lymphocytes Relative: 30.5 % (ref 12.0–46.0)
Lymphs Abs: 1.4 10*3/uL (ref 0.7–4.0)
MCHC: 32.6 g/dL (ref 30.0–36.0)
MCV: 85.7 fl (ref 78.0–100.0)
Monocytes Absolute: 0.4 10*3/uL (ref 0.1–1.0)
Monocytes Relative: 8.9 % (ref 3.0–12.0)
Neutro Abs: 2.6 10*3/uL (ref 1.4–7.7)
Neutrophils Relative %: 58.6 % (ref 43.0–77.0)
Platelets: 284 10*3/uL (ref 150.0–400.0)
RBC: 5.2 Mil/uL (ref 4.22–5.81)
RDW: 14.4 % (ref 11.5–15.5)
WBC: 4.5 10*3/uL (ref 4.0–10.5)

## 2023-12-14 LAB — COMPREHENSIVE METABOLIC PANEL WITH GFR
ALT: 33 U/L (ref 0–53)
AST: 22 U/L (ref 0–37)
Albumin: 4.3 g/dL (ref 3.5–5.2)
Alkaline Phosphatase: 94 U/L (ref 39–117)
BUN: 13 mg/dL (ref 6–23)
CO2: 24 meq/L (ref 19–32)
Calcium: 9.3 mg/dL (ref 8.4–10.5)
Chloride: 104 meq/L (ref 96–112)
Creatinine, Ser: 1.04 mg/dL (ref 0.40–1.50)
GFR: 80.5 mL/min (ref >60.00)
Glucose, Bld: 93 mg/dL (ref 70–99)
Potassium: 4.6 meq/L (ref 3.5–5.1)
Sodium: 138 meq/L (ref 135–145)
Total Bilirubin: 0.8 mg/dL (ref 0.2–1.2)
Total Protein: 7.1 g/dL (ref 6.0–8.3)

## 2023-12-14 LAB — LIPID PANEL
Cholesterol: 195 mg/dL (ref 0–200)
HDL: 75.5 mg/dL (ref >39.00)
LDL Cholesterol: 110 mg/dL — ABNORMAL HIGH (ref 0–99)
NonHDL: 119.42
Total CHOL/HDL Ratio: 3
Triglycerides: 49 mg/dL (ref 0.0–149.0)
VLDL: 9.8 mg/dL (ref 0.0–40.0)

## 2023-12-14 LAB — HEMOGLOBIN A1C: Hgb A1c MFr Bld: 5.6 % (ref 4.6–6.5)

## 2023-12-14 LAB — PSA: PSA: 0.95 ng/mL (ref 0.10–4.00)

## 2023-12-14 LAB — TSH: TSH: 1.97 u[IU]/mL (ref 0.35–5.50)

## 2023-12-14 MED ORDER — SILDENAFIL CITRATE 20 MG PO TABS
ORAL_TABLET | ORAL | 11 refills | Status: AC
Start: 2023-12-14 — End: ?

## 2023-12-14 NOTE — Patient Instructions (Addendum)
-  Physical exam completed today. No concerns.  -Refilled Sildenafil  for erectile dysfunction.  -Ordered labs. Office will call with lab results and you will see them on MyChart. -Follow up in 1 year for a physical.

## 2023-12-14 NOTE — Progress Notes (Signed)
 Complete physical exam  Patient: Aaron Mcgee   DOB: 01-Aug-1965   58 y.o. Male  MRN: [id number]  Subjective:     Chief Complaint  Patient presents with   Annual Exam    Aaron Mcgee is a 58 y.o. male who presents today for a complete physical exam. He reports consuming a general diet. Home exercise routine includes situps, pushups, and jogging about 2 times a week. He generally feels well. He reports sleeping well. He does not have additional problems to discuss today.    Most recent fall risk assessment:    12/11/2022    1:57 PM  Fall Risk   Falls in the past year? 0  Number falls in past yr: 0  Injury with Fall? 0  Risk for fall due to : No Fall Risks     Most recent depression screenings:    12/14/2023    9:10 AM 12/11/2022    1:57 PM  PHQ 2/9 Scores  PHQ - 2 Score 0 0  PHQ- 9 Score 0 0    Vision:Not within last year  and Dental: No current dental problems and Receives regular dental care  Past Medical History:  Diagnosis Date   Erectile dysfunction    Past Surgical History:  Procedure Laterality Date   WISDOM TOOTH EXTRACTION     Social History   Tobacco Use   Smoking status: Never   Smokeless tobacco: Never  Vaping Use   Vaping status: Never Used  Substance Use Topics   Alcohol use: Not Currently   Drug use: No   Social History   Socioeconomic History   Marital status: Divorced    Spouse name: Not on file   Number of children: 2   Years of education: Not on file   Highest education level: Bachelor's degree (e.g., BA, AB, BS)  Occupational History   Not on file  Tobacco Use   Smoking status: Never   Smokeless tobacco: Never  Vaping Use   Vaping status: Never Used  Substance and Sexual Activity   Alcohol use: Not Currently   Drug use: No   Sexual activity: Yes    Birth control/protection: Condom  Other Topics Concern   Not on file  Social History Narrative   Not on file   Social Drivers of Health   Financial Resource  Strain: Patient Declined (12/14/2023)   Overall Financial Resource Strain (CARDIA)    Difficulty of Paying Living Expenses: Patient declined  Food Insecurity: Patient Declined (12/14/2023)   Hunger Vital Sign    Worried About Running Out of Food in the Last Year: Patient declined    Ran Out of Food in the Last Year: Patient declined  Transportation Needs: Patient Declined (12/14/2023)   PRAPARE - Administrator, Civil Service (Medical): Patient declined    Lack of Transportation (Non-Medical): Patient declined  Physical Activity: Insufficiently Active (12/14/2023)   Exercise Vital Sign    Days of Exercise per Week: 3 days    Minutes of Exercise per Session: 20 min  Stress: No Stress Concern Present (12/14/2023)   [employer] of Occupational Health - Occupational Stress Questionnaire    Feeling of Stress : Not at all  Social Connections: Unknown (12/14/2023)   Social Connection and Isolation Panel [NHANES]    Frequency of Communication with Friends and Family: Patient declined    Frequency of Social Gatherings with Friends and Family: Patient declined    Attends Religious Services: Patient declined  Active Member of Clubs or Organizations: Patient declined    Attends Banker Meetings: Not on file    Marital Status: Patient declined  Intimate Partner Violence: Patient Declined (12/11/2022)   Humiliation, Afraid, Rape, and Kick questionnaire    Fear of Current or Ex-Partner: Patient declined    Emotionally Abused: Patient declined    Physically Abused: Patient declined    Sexually Abused: Patient declined   Family Status  Relation Name Status   Mother Haziel Molner Alive   Father Todd Jelinski Alive   Sister  Alive   Sister  Alive   Brother Roderick Tynan Alive   Daughter  Alive   Son  Alive   MGM  Deceased   MGF  Deceased   PGM  Deceased   PGF  Deceased   Neg Hx  (Not Specified)  No partnership data on file   Family History  Problem Relation Age  of Onset   Hypothyroidism Mother    Arthritis Mother    Cancer Father        prostate 13   Hypertension Brother    Colon cancer Neg Hx    Colon polyps Neg Hx    Esophageal cancer Neg Hx    Rectal cancer Neg Hx    Stomach cancer Neg Hx    No Known Allergies   Patient Care Team: Francenia Ingle, NP as PCP - General (Family Medicine)   Outpatient Medications Prior to Visit  Medication Sig   [DISCONTINUED] Cholecalciferol (VITAMIN D3 PO) Take by mouth.   [DISCONTINUED] ibuprofen (ADVIL,MOTRIN) 200 MG tablet Take 400 mg by mouth every 6 (six) hours as needed (pain).   [DISCONTINUED] MAGNESIUM PO Take by mouth.   [DISCONTINUED] Multiple Vitamins-Minerals (ZINC PO) Take by mouth.   [DISCONTINUED] sildenafil  (REVATIO ) 20 MG tablet TAKE UP TO 5 TABLETS BY MOUTH ONCE DAILY AS NEEDED FOR ERECTILE DYSFUNCTION   No facility-administered medications prior to visit.   Review of Systems  Constitutional: Negative.   HENT: Negative.    Eyes: Negative.   Respiratory: Negative.    Cardiovascular: Negative.   Gastrointestinal: Negative.   Genitourinary: Negative.   Musculoskeletal:  Positive for joint pain and myalgias.  Skin: Negative.   Neurological: Negative.   Psychiatric/Behavioral: Negative.     See HPI above   Objective:   BP (!) 148/90   Pulse 80   Temp 98.2 F (36.8 C) (Oral)   Ht 5' 7.5" (1.715 m)   Wt 179 lb (81.2 kg)   SpO2 96%   BMI 27.62 kg/m    Physical Exam Vitals reviewed.  Constitutional:      General: He is not in acute distress.    Appearance: Normal appearance. He is overweight. He is not ill-appearing or toxic-appearing.  HENT:     Head: Normocephalic and atraumatic.     Right Ear: Tympanic membrane, ear canal and external ear normal. There is no impacted cerumen.     Left Ear: Tympanic membrane, ear canal and external ear normal. There is no impacted cerumen.     Nose:     Right Sinus: No maxillary sinus tenderness or frontal sinus tenderness.      Left Sinus: No maxillary sinus tenderness or frontal sinus tenderness.     Mouth/Throat:     Mouth: Mucous membranes are moist.     Pharynx: Oropharynx is clear. Uvula midline. No pharyngeal swelling, oropharyngeal exudate, posterior oropharyngeal erythema, uvula swelling or postnasal drip.  Eyes:     General:  Right eye: No discharge.        Left eye: No discharge.     Conjunctiva/sclera: Conjunctivae normal.     Pupils: Pupils are equal, round, and reactive to light.  Neck:     Thyroid : No thyromegaly.  Cardiovascular:     Rate and Rhythm: Normal rate and regular rhythm.     Pulses:          Posterior tibial pulses are 2+ on the right side and 2+ on the left side.     Heart sounds: Normal heart sounds. No murmur heard.    No friction rub. No gallop.  Pulmonary:     Effort: Pulmonary effort is normal. No respiratory distress.     Breath sounds: Normal breath sounds.  Abdominal:     General: Abdomen is flat. Bowel sounds are normal. There is no distension.     Palpations: Abdomen is soft.     Tenderness: There is no abdominal tenderness. There is no right CVA tenderness or left CVA tenderness.  Musculoskeletal:        General: Normal range of motion.     Cervical back: Normal range of motion.     Right lower leg: No edema.     Left lower leg: No edema.  Lymphadenopathy:     Head:     Right side of head: No submental or submandibular adenopathy.     Left side of head: No submental or submandibular adenopathy.     Cervical: No cervical adenopathy.  Skin:    General: Skin is warm and dry.  Neurological:     General: No focal deficit present.     Mental Status: He is alert and oriented to person, place, and time. Mental status is at baseline.  Psychiatric:        Mood and Affect: Mood normal.        Behavior: Behavior normal. Behavior is cooperative.        Thought Content: Thought content normal.        Judgment: Judgment normal.        Assessment & Plan:     Routine Health Maintenance and Physical Exam  Immunization History  Administered Date(s) Administered   Influenza-Unspecified 05/14/2018   PFIZER Comirnaty(Gray Top)Covid-19 Tri-Sucrose Vaccine 03/15/2020, 04/05/2020   Tdap 07/14/2010, 08/10/2020   Zoster Recombinant(Shingrix ) 07/18/2020, 09/17/2020    Health Maintenance  Topic Date Due   COVID-19 Vaccine (3 - 2024-25 season) 03/15/2023   INFLUENZA VACCINE  02/12/2024   DTaP/Tdap/Td (3 - Td or Tdap) 08/10/2030   Colonoscopy  09/12/2030   Hepatitis C Screening  Completed   HIV Screening  Completed   Zoster Vaccines- Shingrix   Completed   HPV VACCINES  Aged Out   Meningococcal B Vaccine  Aged Out    Discussed health benefits of physical activity, and encouraged him to engage in regular exercise appropriate for his age and condition.  Overweight -     CBC with Differential/Platelet -     Comprehensive metabolic panel with GFR -     Lipid panel -     TSH -     Hemoglobin A1c  Prostate cancer screening -     PSA  Erectile dysfunction, unspecified erectile dysfunction type -     Sildenafil  Citrate; TAKE UP TO 5 TABLETS BY MOUTH ONCE DAILY AS NEEDED FOR ERECTILE DYSFUNCTION  Dispense: 30 tablet; Refill: 11  Family history of prostate cancer -     PSA  1.Review health maintenance:  -Covid  booster: Declines  2.Physical exam completed today. No concerns.  3.Refilled Sildenafil  for erectile dysfunction.  4.Ordered labs (CBC, CMP, Lipid panel, TSH, A1c, and PSA) based on BMI-overweight and screening.  5.Follow up in 1 year for a physical.    Lolly Glaus, NP

## 2024-12-14 ENCOUNTER — Encounter: Admitting: Family Medicine
